# Patient Record
Sex: Female | Born: 1960 | Race: Black or African American | Hispanic: No | Smoking: Never smoker
Health system: Southern US, Community
[De-identification: ages and names within clinical notes are randomized; demographics above are authoritative.]

## PROBLEM LIST (undated history)

## (undated) DIAGNOSIS — E039 Hypothyroidism, unspecified: Secondary | ICD-10-CM

## (undated) DIAGNOSIS — J45909 Unspecified asthma, uncomplicated: Secondary | ICD-10-CM

## (undated) HISTORY — PX: OTHER SURGICAL HISTORY: SHX169

---

## 1998-01-24 ENCOUNTER — Encounter: Admission: RE | Admit: 1998-01-24 | Discharge: 1998-04-24 | Payer: Self-pay | Admitting: Radiation Oncology

## 2004-12-26 ENCOUNTER — Emergency Department (HOSPITAL_COMMUNITY): Admission: EM | Admit: 2004-12-26 | Discharge: 2004-12-26 | Payer: Self-pay | Admitting: Emergency Medicine

## 2005-03-18 ENCOUNTER — Ambulatory Visit: Payer: Self-pay | Admitting: Internal Medicine

## 2006-09-03 ENCOUNTER — Ambulatory Visit: Payer: Self-pay | Admitting: Internal Medicine

## 2007-10-05 ENCOUNTER — Ambulatory Visit: Payer: Self-pay | Admitting: Gastroenterology

## 2007-10-19 ENCOUNTER — Ambulatory Visit (HOSPITAL_COMMUNITY): Admission: RE | Admit: 2007-10-19 | Discharge: 2007-10-19 | Payer: Self-pay | Admitting: Gastroenterology

## 2008-03-22 ENCOUNTER — Ambulatory Visit: Payer: Self-pay | Admitting: Gastroenterology

## 2008-10-31 ENCOUNTER — Ambulatory Visit: Payer: Self-pay | Admitting: Gastroenterology

## 2008-11-07 ENCOUNTER — Ambulatory Visit (HOSPITAL_COMMUNITY): Admission: RE | Admit: 2008-11-07 | Discharge: 2008-11-07 | Payer: Self-pay | Admitting: Gastroenterology

## 2009-04-16 ENCOUNTER — Encounter (INDEPENDENT_AMBULATORY_CARE_PROVIDER_SITE_OTHER): Payer: Self-pay

## 2009-04-18 ENCOUNTER — Encounter: Payer: Self-pay | Admitting: Gastroenterology

## 2009-06-06 ENCOUNTER — Ambulatory Visit: Payer: Self-pay | Admitting: Gastroenterology

## 2009-06-07 DIAGNOSIS — K7460 Unspecified cirrhosis of liver: Secondary | ICD-10-CM | POA: Insufficient documentation

## 2009-10-18 ENCOUNTER — Encounter (INDEPENDENT_AMBULATORY_CARE_PROVIDER_SITE_OTHER): Payer: Self-pay | Admitting: *Deleted

## 2009-11-19 ENCOUNTER — Encounter (INDEPENDENT_AMBULATORY_CARE_PROVIDER_SITE_OTHER): Payer: Self-pay | Admitting: *Deleted

## 2009-12-11 ENCOUNTER — Ambulatory Visit: Payer: Self-pay | Admitting: Gastroenterology

## 2009-12-11 LAB — CONVERTED CEMR LAB
ALT: 16 units/L (ref 0–35)
AST: 22 units/L (ref 0–37)
Basophils Relative: 0 % (ref 0–1)
Chloride: 103 meq/L (ref 96–112)
Creatinine, Ser: 1.15 mg/dL (ref 0.40–1.20)
Eosinophils Absolute: 0.6 10*3/uL (ref 0.0–0.7)
Eosinophils Relative: 6 % — ABNORMAL HIGH (ref 0–5)
HCT: 36.3 % (ref 36.0–46.0)
Hemoglobin: 12.2 g/dL (ref 12.0–15.0)
Lymphs Abs: 3.5 10*3/uL (ref 0.7–4.0)
MCHC: 33.6 g/dL (ref 30.0–36.0)
MCV: 95 fL (ref 78.0–100.0)
Monocytes Absolute: 0.8 10*3/uL (ref 0.1–1.0)
Monocytes Relative: 8 % (ref 3–12)
Neutrophils Relative %: 54 % (ref 43–77)
RBC: 3.82 M/uL — ABNORMAL LOW (ref 3.87–5.11)
Sodium: 136 meq/L (ref 135–145)
Total Bilirubin: 0.7 mg/dL (ref 0.3–1.2)
WBC: 10.5 10*3/uL (ref 4.0–10.5)

## 2009-12-14 ENCOUNTER — Telehealth (INDEPENDENT_AMBULATORY_CARE_PROVIDER_SITE_OTHER): Payer: Self-pay

## 2009-12-20 ENCOUNTER — Ambulatory Visit (HOSPITAL_COMMUNITY): Admission: RE | Admit: 2009-12-20 | Discharge: 2009-12-20 | Payer: Self-pay | Admitting: Gastroenterology

## 2010-06-27 ENCOUNTER — Ambulatory Visit: Payer: Self-pay | Admitting: Gastroenterology

## 2010-06-27 DIAGNOSIS — K754 Autoimmune hepatitis: Secondary | ICD-10-CM

## 2010-09-24 ENCOUNTER — Encounter (INDEPENDENT_AMBULATORY_CARE_PROVIDER_SITE_OTHER): Payer: Self-pay | Admitting: *Deleted

## 2010-09-26 ENCOUNTER — Telehealth (INDEPENDENT_AMBULATORY_CARE_PROVIDER_SITE_OTHER): Payer: Self-pay

## 2010-11-19 NOTE — Letter (Signed)
Summary: ABD [place] ORDER  ABD [place] ORDER   Imported By: Ave Filter 12/11/2009 16:36:33  _____________________________________________________________________  External Attachment:    Type:   Image     Comment:   External Document

## 2010-11-19 NOTE — Assessment & Plan Note (Signed)
Summary: AIH, CIRRHOSIS   Visit Type:  Follow-up Visit Primary Care Provider:  Juanetta Gosling, M.D.  Chief Complaint:  follow up- doing ok.  History of Present Illness: Working out on the treadmill 1 hour a day. No fatigue, itching, or yellow eyes. No questions or concerns.  Current Medications (verified): 1)  Advair Inhalet 2)  Levothroid 100 Mcg Tabs (Levothyroxine Sodium) .... Once Daily 3)  Calcium 4)  Vitamin D 3 1000iy .... Once Daily 5)  Loestrin Fe 1/20 1-20 Mg-Mcg Tabs (Norethin Ace-Eth Estrad-Fe) .... Once Daily  Allergies (verified): 1)  ! Advil 2)  ! Ibuprofen  Past History:  Past Medical History: Last updated: 06/05/2009 Asthma Hypothyroidism Autoimmune hepatitis with evidence of cirrhosis  Review of Systems       FEB 2011: NL CMP, CBC, PT/INR, AFP, ABD U/S-CIRRHOSIS  Vital Signs:  Patient profile:   50 year old female Height:      63 inches Weight:      190 pounds BMI:     33.78 Temp:     98.0 degrees F oral Pulse rate:   76 / minute BP sitting:   124 / 80  (left arm) Cuff size:   regular  Vitals Entered By: Hendricks Limes LPN (June 27, 2010 3:44 PM)  Physical Exam  General:  Well developed, well nourished, no acute distress. Head:  Normocephalic and atraumatic. Eyes:  PERRL, no icterus. Lungs:  Clear throughout to auscultation. Heart:  Regular rate and rhythm; no murmurs. Abdomen:  Soft, nontender and nondistended. Normal bowel sounds.  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 OPV APR 2012 per pt request. Repeat labs and U/S APR 2012.  CC: PCP  Appended Document: AIH, CIRRHOSIS F/U OPV IN APRIL 2012 IS IN THE COMPUTER  Appended Document: Orders Update    Clinical Lists Changes  Problems: Added new problem of AUTOIMMUNE HEPATITIS (ICD-571.42) Orders: Added new Service order of Est. Patient Level II ([address]109) - Signed

## 2010-11-19 NOTE — Progress Notes (Signed)
Summary: Notify pt of normal labs  Phone Note Outgoing Call   Call placed by: Koray Soter Call placed to: Patient Summary of Call: Called pt to inform of normal Hemoglobin, plts, lft's and afp tumor marker per Dr. Darrick Penna note in dictation from OV. LMOM for a return call. Initial call taken by: Cloria Spring LPN,  December 14, 2009 11:29 AM     Appended Document: Notify pt of normal labs Pt informed.

## 2010-11-19 NOTE — Medication Information (Signed)
Summary: LEVOTHYROXIN  LEVOTHYROXIN   Imported By: Rexene Alberts 09/24/2010 16:16:10  _____________________________________________________________________  External Attachment:    Type:   Image     Comment:   External Document  Appended Document: LEVOTHYROXIN Pt needs to call PCP for refills.  Appended Document: LEVOTHYROXIN pharmacy aware

## 2010-11-19 NOTE — Assessment & Plan Note (Signed)
Summary: CIRROHSIS, AIH   Visit Type:  Follow-up Visit Primary Care Provider:  Juanetta Gosling, M.D.  Chief Complaint:  follow up.  History of Present Illness: No itching, weight loss, nausea, vomiting, yellow eyes. Appetite is good. Working causes fatigue.  Current Medications (verified): 1)  Advair Inhalet 2)  Levothyroxine 3)  Calcium 4)  Vitamins  Allergies (verified): 1)  ! Advil 2)  ! Ibuprofen  Past History:  Past Medical History: Last updated: 06/05/2009 Asthma Hypothyroidism Autoimmune hepatitis with evidence of cirrhosis  Vital Signs:  Patient profile:   50 year old female Height:      63 inches Weight:      188 pounds BMI:     33.42 Temp:     97.7 degrees F oral Pulse rate:   68 / minute BP sitting:   118 / 72  (left arm) Cuff size:   regular  Vitals Entered By: Hendricks Limes LPN (December 11, 2009 4:12 PM)  Physical Exam  General:  Well developed, well nourished, no acute distress. Head:  Normocephalic and atraumatic. Lungs:  Clear throughout to auscultation. Heart:  Regular rate and rhythm; no murmurs. Abdomen:  Soft, nontender and nondistended. Normal bowel sounds.  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 Currently in remission. Will need AFP, PT/INR, CBC, & CMP and ABD U/S. OPV in 6 mos.  CC: PCP  Orders: T-CBC w/Diff ([id number]) T-Comprehensive Metabolic Panel [phone]) T-AFP Tumor Markers ([id number]) T-PT (Prothrombin Time) (57846) Est. Patient Level II (96295)  ADDENDUM: 22311-Please call pt her Hb, plts, and liver enzymes are normal. Her tumor marker is normal.

## 2010-11-19 NOTE — Letter (Signed)
Summary: Appointment Reminder  [facility]  [address]   [place], [place] [address]   Phone: [phone]  Fax: [phone]       November 19, 2009   PAIGE MONARREZ [address] [place], [place]  [address] 1961-05-04    Dear Ms. Maisie Fus,  We have been unable to reach you by phone to schedule a follow up   appointment that was recommended for you by Dr. Jena Gauss. It is very   important that we reach you to schedule an appointment. We hope that you  allow [place] to participate in your health care needs. Please contact us at  [phone] at your earliest convenience to schedule your appointment.  Sincerely,    [facility] R. Roetta Sessions, M.D.    Kassie Mends, M.D. Lorenza Burton, FNP-BC    Tana Coast, PA-C Phone: [phone]    Fax: [phone]

## 2010-11-21 NOTE — Progress Notes (Signed)
Summary: pt upset about refills  Phone Note Call from Patient Call back at Home Phone [phone]   Caller: Patient Summary of Call: pt called wanting to know why we were not refilling her tyroid medication. she stated she has never had any problems before getting it refilled and was very upset that we werent refilling it. she stated we were "like the grinch that stole Christmas". Pt also said NUR put her on thyroid medication and told her to never be with out it. pt was talking so fast and was not allowing me to answer her questions. When she finally stopped I informed her that [facility] wanted her to get her refill from her PCP and pt stated "that was just fine and I will never come to your office again" and hung up the phone. Initial call taken by: Hendricks Limes LPN,  September 26, 2010 10:59 AM     Appended Document: pt upset about refills FAX PHONE NOTE TO DR. Patty Sermons OFFICE.  Appended Document: pt upset about refills PHONE NOTE FAXED AND FUTURE APPTS CANCELED

## 2011-03-04 NOTE — Assessment & Plan Note (Signed)
NAME:  Joyce Mcdonald, Joyce Mcdonald                 CHART#:  [id number]   DATE:  03/22/2008                       DOB:  [birth year]   REFERRING PHYSICIAN:  Oneal Deputy. Juanetta Gosling, MD   PROBLEM LIST:  1. Autoimmune hepatitis with cirrhosis, on no steroid treatment since      1998.  2. Hypothyroidism.  3. Asthma.   SUBJECTIVE:  Joyce Mcdonald is a 50 year old female who presents as a return-  patient visit.  She was last seen in December 2007.  She denies any  itching.  Her energy level is good.  She has not had any rectal bleeding  or change in her bowel habits.  She is having 2 bowel movements a day.  Her appetite is good.   MEDICATIONS:  1. Advair inhaler.  2. Levothyroxine.  3. Calcium.   OBJECTIVE:  Physical Exam: Weight 187 pounds, height 5 feet 4 inches,  BMI 32.1 (obese), temperature 98.1, blood pressure 132/82, and pulse  76.GENERAL:  She is in no apparent distress, alert and oriented  x4.LUNGS:  Clear to auscultation bilaterally.  CARDIOVASCULAR:  Regular rhythm.ABDOMEN:  Bowel sounds are present.  Soft, nontender, nondistended, and obese.   ASSESSMENT:  Joyce Mcdonald is a 50 year old female who was diagnosed with  autoimmune hepatitis in the 90s.  She had an ultrasound in December  2008, which showed diffuse hepatocellular disease consistent with  cirrhosis.  Her AFP in December was normal.  She is obese, which can  adversely affect people who have cirrhosis of the liver.   Thank you for allowing me to see Joyce Mcdonald in consultation.  My  recommendations as follow.   RECOMMENDATIONS:  1. I recommended to Joyce Mcdonald that she lose 10 pounds within the next      6 months.  More would be better, but 10 pounds would be      satisfactory.  I did explain to her the risk of fat deposition into      a cirrhotic liver may cause worsening of cirrhosis and elevated      liver enzymes.  2. We will check alpha fetoprotein and hepatic panel today.  3. She declined a handout on low-fat diet, and  she  knows what she      needs to do.  As well, I discussed the need for age-appropriate      colorectal cancer screening.  The [employer] [facility]      Gastroenterology suggests that African Americans should be screened      at age 60.  I told her I will be more than happy to ask her      insurance company if they would cover the cost of the procedure,      and currently she has declined.  4. Follow up appointment in 6 months.       Kassie Mcdonald, M.D.  Electronically Signed     SM/MEDQ  D:  03/22/2008  T:  03/23/2008  Job:  [id number]   cc:   Joyce Mcdonald, M.D.

## 2011-03-04 NOTE — Assessment & Plan Note (Signed)
NAME:  Joyce Mcdonald, Joyce Mcdonald                 CHART#:  [id number]   DATE:  10/05/2007                       DOB:  1960-10-22   REFERRING PHYSICIAN:  Kari Baars.   PROBLEM LIST:  1. Autoimmune hepatitis with cirrhosis, no steroid treatments since      1998.  2. Hypothyroidism.  3. Asthma.   SUBJECTIVE:  Joyce Mcdonald is a 50 year old female who was last seen in  November 2007.  She denies any itching, abdominal pain, or jaundice.  Her energy level is normal.  Her appetite is good.  She has 1 to 2  normal bowel movements a day.  She denies any bright red blood per  rectum, black, tarry stools, or hematemesis.   PAST SURGICAL HISTORY:  Toe surgery.   FAMILY HISTORY:  She has no family history of liver cancer, colon  polyps, or colon cancer.   ALLERGIES:  IBUPROFEN AND ADVIL CAUSE HER TO FEEL SHORT OF BREATH.   MEDICATIONS:  1. Advair inhaler.  2. Levothyroxine.  3. Calcium.   SOCIAL HISTORY:  She works at [employer] in [place] for the last 5  years.  She denies any tobacco or alcohol use.   PHYSICAL EXAMINATION:  Weight 185 pounds (up 9 pounds since November of  2007).  Height 5 feet 3 inches.  BMI 30.8 (obese).  Temperature 97.9.  Blood pressure 128/80.  Pulse 64.  GENERAL:  She is in no apparent distress, alert and oriented x4.  HEENT:  Atraumatic, normocephalic.  Pupils equal and reactive to light.  Mouth, no oral lesions.  Posterior pharynx without erythema or exudate,  sclerae anicteric.  NECK:  Full range of motion, no lymphadenopathy.  LUNGS:  Clear to auscultation bilaterally.  CARDIOVASCULAR:  Exam is a regular rhythm.  No murmur.  Normal S1 and  S2.  ABDOMEN:  Bowel sounds are present.  Soft, nontender, and nondistended.  No rebound or guarding, obese.  NEURO:  She has no focal neurologic deficits.   ASSESSMENT:  Joyce Mcdonald is a 50 year old female with autoimmune  hepatitis which is believed to be in remission.  She did have a biopsy  during her initial workup that  showed fibrosis and cirrhosis.  She  reports being vaccinated for hepatitis A and possibly hepatitis B.   Thank you for allowing me to see Joyce Mcdonald in consultation.  My  recommendations follow.   RECOMMENDATIONS:  1. She should have an alpha-fetoprotein every 6 months.  It will be      drawn today.  She will also have a CBC, complete metabolic panel,      and PT/INR today.  2. She will have an abdominal ultrasound yearly if she has evidence      for cirrhosis on the exam.  3. Follow up in 6 months.  4. We will check a hepatitis B surface antibody and hepatitis A IgG to      make sure she has been appropriately vaccinated.  5. I did caution her that she needs to lose about 10 to 15 pounds      because being obese increases her risk for cirrhosis because she      can have fatty infiltration of the liver.  6. We will call Joyce Mcdonald with the results of her labs at [phone] or      [phone] (  cell).       Kassie Mends, M.D.  Electronically Signed     SM/MEDQ  D:  10/05/2007  T:  10/06/2007  Job:  [id number]   cc:   Ramon Dredge L. Juanetta Gosling, M.D.

## 2011-03-04 NOTE — Assessment & Plan Note (Signed)
NAME:  Joyce Mcdonald, Joyce Mcdonald                 CHART#:  [id number]   DATE:  10/31/2008                       DOB:  Feb 27, 1961   REFERRING PHYSICIAN:  Oneal Deputy. Juanetta Gosling, MD   PROBLEM LIST:  1. Autoimmune hepatitis with evidence of cirrhosis and no steroid      treatment since 1998.  2. Hypothyroidism.  3. Asthma.   SUBJECTIVE:  The patient is a 50 year old female who presents as a  return patient visit.  She denies any itching yellow eyes, nausea,  vomiting, or rectal bleeding.  She does not use tobacco or alcohol  products.  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  For her enjoyment, she  reads the Bible and inspirational book such as Delrae Rend.  She has 2  bowel movements a day.  She is beginning to exercise once a week on the  treadmill for an hour.   MEDICATIONS:  1. Advair inhaler.  2. Levothyroxine.  3. Calcium daily.   OBJECTIVE:  VITAL SIGNS:  Weight 183 pounds (down 4 pounds since June  2009), height 5 feet 4 inches, temperature 98, blood pressure 110/78,  and pulse 64.GENERAL:  She is in no apparent distress.  Alert and  oriented x4.HEENT:  Atraumatic, normocephalic.  Pupils equal and  reactive to the light.  Mouth, no oral lesions.  Posterior pharynx  without erythema or exudate.  NECK:  Full range of motion.  No lymphadenopathy.LUNGS:  Clear to  auscultation bilaterally.CARDIOVASCULAR:  Regular rhythm.  No murmur.  Normal S1 and S2.ABDOMEN:  Bowel sounds are present, soft, nontender,  nondistended, and obese.   ASSESSMENT:  The patient is a 50 year old female with autoimmune  hepatitis diagnosed in the 90s and is in remission.  Her last ultrasound  was in December 2008.  She also had an alpha-fetoprotein, which was  within normal limits in June 2009.  Thank you for allowing me to see the  patient in consultation.  My recommendations follow.   RECOMMENDATIONS:  1. I encouraged the patient to proceed with weight loss.  Encouraged      her to increase her exercise 3 times a day  to assist with that.  2. She needs an AFP twice a year and ultrasound yearly.  Drew AFP on      today and schedule for ultrasound.  3. She has a follow up appointment to see me in 6 months.   ADDENDUM 14782:  AFP nl, U/S: cirrhoisis.       Kassie Mends, M.D.  Electronically Signed     SM/MEDQ  D:  10/31/2008  T:  11/01/2008  Job:  [id number]   cc:   Ramon Dredge L. Juanetta Gosling, M.D.

## 2012-04-15 ENCOUNTER — Other Ambulatory Visit (HOSPITAL_COMMUNITY): Payer: Self-pay | Admitting: Pulmonary Disease

## 2012-04-15 DIAGNOSIS — Z139 Encounter for screening, unspecified: Secondary | ICD-10-CM

## 2012-04-23 ENCOUNTER — Ambulatory Visit (HOSPITAL_COMMUNITY)
Admission: RE | Admit: 2012-04-23 | Discharge: 2012-04-23 | Disposition: A | Discharge status: Home / Self Care | Payer: BC Managed Care – PPO | Source: Ambulatory Visit | Attending: Pulmonary Disease | Admitting: Pulmonary Disease

## 2012-04-23 DIAGNOSIS — Z139 Encounter for screening, unspecified: Secondary | ICD-10-CM

## 2012-04-23 DIAGNOSIS — Z1231 Encounter for screening mammogram for malignant neoplasm of breast: Secondary | ICD-10-CM | POA: Insufficient documentation

## 2012-04-26 ENCOUNTER — Telehealth: Payer: Self-pay | Admitting: Urgent Care

## 2012-04-26 NOTE — Telephone Encounter (Signed)
Pt has called back twice since this morning. Her Insurance is BCBS, group # [id number], subscriber # [id number]. She said you could [facility] with her procedure date and time.

## 2012-04-26 NOTE — Telephone Encounter (Signed)
LMOM for pt to call to be triaged.

## 2012-04-26 NOTE — Telephone Encounter (Signed)
Pt called while on her lunch break and said you can reach her at home or she will call us around 4pm. She had received a letter recently about setting her up for tcs

## 2012-04-27 ENCOUNTER — Telehealth: Payer: Self-pay

## 2012-04-27 ENCOUNTER — Other Ambulatory Visit: Payer: Self-pay

## 2012-04-27 DIAGNOSIS — Z139 Encounter for screening, unspecified: Secondary | ICD-10-CM

## 2012-04-27 NOTE — Telephone Encounter (Signed)
Gastroenterology Pre-Procedure Form    Request Date: 04/27/2012       Requesting Physician: Dr. Juanetta Gosling     PATIENT INFORMATION:  Joyce Mcdonald is a 51 y.o., female (DOB=1961-08-18).  PROCEDURE: Procedure(s) requested: colonoscopy Procedure Reason: screening for colon cancer  PATIENT REVIEW QUESTIONS: The patient reports the following:   1. Diabetes Melitis: no 2. Joint replacements in the past 12 months: no 3. Major health problems in the past 3 months: no 4. Has an artificial valve or MVP:no 5. Has been advised in past to take antibiotics in advance of a procedure like teeth cleaning: no}    MEDICATIONS & ALLERGIES:    Patient reports the following regarding taking any blood thinners:   Plavix? no Aspirin?no Coumadin?  no  Patient confirms/reports the following medications:  Current Outpatient Prescriptions  Medication Sig Dispense Refill  . Fluticasone-Salmeterol (ADVAIR) 100-50 MCG/DOSE AEPB Inhale 1 puff into the lungs every 12 (twelve) hours.      Marland Kitchen levothyroxine (SYNTHROID, LEVOTHROID) 112 MCG tablet Take 112 mcg by mouth daily.        Patient confirms/reports the following allergies:  Allergies  Allergen Reactions  . Ibuprofen     Patient is appropriate to schedule for requested procedure(s): yes  AUTHORIZATION INFORMATION Primary Insurance:   ID #:   Group #:  Pre-Cert / Auth required: Pre-Cert / Auth #:   Secondary Insurance: ID #:   Group #:  Pre-Cert / Auth required:  Pre-Cert / Auth #:   No orders of the defined types were placed in this encounter.    SCHEDULE INFORMATION: Procedure has been scheduled as follows:  Date: 05/10/2012      Time: 12:30 PM  Location: [facility] Short Stay  This Gastroenterology Pre-Precedure Form is being routed to the following provider(s) for review: Jonette Eva, MD

## 2012-04-28 ENCOUNTER — Encounter (HOSPITAL_COMMUNITY): Payer: Self-pay | Admitting: Pharmacy Technician

## 2012-04-28 MED ORDER — SOD PICOSULFATE-MAG OX-CIT ACD 10-3.5-12 MG-GM-GM PO PACK: 1.0000 | PACK | Freq: Once | ORAL | Status: DC

## 2012-04-28 NOTE — Telephone Encounter (Signed)
Pt scheduled for 05/10/2012 @ 12:30 PM for colonoscopy.

## 2012-04-28 NOTE — Telephone Encounter (Signed)
PREPOPIK-DRINK WATER TO KEEP URINE LIGHT YELLOW.  PT SHOULD DROP OFF RX 3 DAYS PRIOR TO PROCEDURE.  

## 2012-04-28 NOTE — Telephone Encounter (Signed)
Pt's prescription and instructions faxed to [facility] in [place].

## 2012-05-10 ENCOUNTER — Encounter (HOSPITAL_COMMUNITY): Payer: Self-pay | Admitting: *Deleted

## 2012-05-10 ENCOUNTER — Ambulatory Visit (HOSPITAL_COMMUNITY)
Admission: RE | Admit: 2012-05-10 | Discharge: 2012-05-10 | Disposition: A | Discharge status: Home / Self Care | Payer: BC Managed Care – PPO | Source: Ambulatory Visit | Attending: Gastroenterology | Admitting: Gastroenterology

## 2012-05-10 ENCOUNTER — Encounter (HOSPITAL_COMMUNITY)
Admission: RE | Disposition: A | Discharge status: Home / Self Care | Payer: Self-pay | Source: Ambulatory Visit | Attending: Gastroenterology

## 2012-05-10 DIAGNOSIS — Z139 Encounter for screening, unspecified: Secondary | ICD-10-CM

## 2012-05-10 DIAGNOSIS — Z1211 Encounter for screening for malignant neoplasm of colon: Secondary | ICD-10-CM | POA: Insufficient documentation

## 2012-05-10 DIAGNOSIS — D126 Benign neoplasm of colon, unspecified: Secondary | ICD-10-CM | POA: Insufficient documentation

## 2012-05-10 DIAGNOSIS — K648 Other hemorrhoids: Secondary | ICD-10-CM | POA: Insufficient documentation

## 2012-05-10 HISTORY — PX: COLONOSCOPY: SHX5424

## 2012-05-10 HISTORY — DX: Unspecified asthma, uncomplicated: J45.909

## 2012-05-10 HISTORY — DX: Hypothyroidism, unspecified: E03.9

## 2012-05-10 SURGERY — COLONOSCOPY
Anesthesia: Moderate Sedation

## 2012-05-10 MED ORDER — MIDAZOLAM HCL 5 MG/5ML IJ SOLN
INTRAMUSCULAR | Status: AC
  Filled 2012-05-10: qty 10

## 2012-05-10 MED ORDER — MEPERIDINE HCL 100 MG/ML IJ SOLN
INTRAMUSCULAR | Status: AC
  Filled 2012-05-10: qty 2

## 2012-05-10 MED ORDER — MEPERIDINE HCL 100 MG/ML IJ SOLN
INTRAMUSCULAR | Status: DC | PRN
  Administered 2012-05-10: 50 mg via INTRAVENOUS
  Administered 2012-05-10: 25 mg via INTRAVENOUS

## 2012-05-10 MED ORDER — SODIUM CHLORIDE 0.45 % IV SOLN
Freq: Once | INTRAVENOUS | Status: AC
  Administered 2012-05-10: 12:00:00 via INTRAVENOUS

## 2012-05-10 MED ORDER — MIDAZOLAM HCL 5 MG/5ML IJ SOLN
INTRAMUSCULAR | Status: DC | PRN
  Administered 2012-05-10 (×2): 2 mg via INTRAVENOUS

## 2012-05-10 MED ORDER — STERILE WATER FOR IRRIGATION IR SOLN
Status: DC | PRN
  Administered 2012-05-10: 13:00:00

## 2012-05-10 NOTE — Op Note (Signed)
[facility] [address] [place], [place]  [address]  COLONOSCOPY PROCEDURE REPORT  PATIENT:  Joyce Mcdonald, Joyce Mcdonald  MR#:  [id number] BIRTHDATE:  03-29-1961, 51 yrs. old  GENDER:  female  ENDOSCOPIST:  Jonette Eva, MD REF. BY:  Kari Baars, M.D. ASSISTANT:  PROCEDURE DATE:  05/10/2012 PROCEDURE:  Colonoscopy with biopsy  INDICATIONS:  Screening PT HAS NOT FOLLOWED UP FOR CIRRHOSIS IN > 2 YEARS. SHE WAS DISSATISFIED WITH A STAFF MEMBER. SHE IS OVERDUE FOR IMAGING, EGD, AND AFP.  MEDICATIONS:   Demerol 75 mg IV, Versed 4 mg IV  DESCRIPTION OF PROCEDURE:    Physical exam was performed. Informed consent was obtained from the patient after explaining the benefits, risks, and alternatives to procedure.  The patient was connected to monitor and placed in left lateral position. Continuous oxygen was provided by nasal cannula and IV medicine administered through an indwelling cannula.  After administration of sedation and rectal exam, the patient's rectum was intubated and the EC-[id number] ([id number]) colonoscope was advanced under direct visualization to the cecum.  The scope was removed slowly by carefully examining the color, texture, anatomy, and integrity mucosa on the way out.  The patient was recovered in endoscopy and discharged home in satisfactory condition. <<PROCEDUREIMAGES>>  FINDINGS:  There was ONE 3 mm sessile polyps identified and removed. in the ascending colon VIA COLD FORCEPS.  SMALL Internal Hemorrhoids were found. NO DIVERTICULA.  PREP QUALITY: GOOD CECAL W/D TIME:    9.5 minutes  COMPLICATIONS:    None  ENDOSCOPIC IMPRESSION: 1) Polyp in the ascending colon 2) Internal hemorrhoids  RECOMMENDATIONS: 1) Await biopsy results 2) High fiber diet. 3) Repeat colonoscopy in 10 years 4) OPV W/ SLF IN WITHIN 3 MOS  REPEAT EXAM:  No  ______________________________ Jonette Eva, MD  CC:  Kari Baars, M.D.  n. eSIGNED:   Marquasia Schmieder at 05/10/2012  01:30 PM  Joyce Mcdonald, [id number]

## 2012-05-10 NOTE — H&P (Signed)
  Primary Care Physician:  Fredirick Maudlin, MD Primary Gastroenterologist:  Dr. Darrick Penna  Pre-Procedure History & Physical: HPI:  Joyce Mcdonald is a 51 y.o. female here for COLON CANCER SCREENING.   Past Medical History  Diagnosis Date  . Asthma   . Hypothyroidism     Past Surgical History  Procedure Date  . Right great toe     Prior to Admission medications   Medication Sig Start Date End Date Taking? Authorizing Provider  acetaminophen (TYLENOL) 500 MG tablet Take 1,000 mg by mouth every 6 (six) hours as needed. For pain   Yes Historical Provider, MD  Calcium Carbonate-Vitamin D (CALCIUM + D PO) Take 1 tablet by mouth daily.   Yes Historical Provider, MD  Fluticasone-Salmeterol (ADVAIR) 100-50 MCG/DOSE AEPB Inhale 1 puff into the lungs every 12 (twelve) hours.   Yes Historical Provider, MD  Naphazoline-Pheniramine (NAPHCON-A OP) Apply 1 drop to eye as needed. For dry eyes   Yes Historical Provider, MD  levothyroxine (SYNTHROID, LEVOTHROID) 112 MCG tablet Take 112 mcg by mouth daily.    Historical Provider, MD    Allergies as of 04/27/2012 - Review Complete 04/27/2012  Allergen Reaction Noted  . Ibuprofen      Family History  Problem Relation Age of Onset  . Colon cancer Neg Hx     History   Social History  . Marital Status: Widowed    Spouse Name: N/A    Number of Children: N/A  . Years of Education: N/A   Occupational History  . Not on file.   Social History Main Topics  . Smoking status: Never Smoker   . Smokeless tobacco: Not on file  . Alcohol Use: No  . Drug Use: No  . Sexually Active:    Other Topics Concern  . Not on file   Social History Narrative  . No narrative on file    Review of Systems: See HPI, otherwise negative ROS   Physical Exam: BP 137/76  Pulse 60  Temp 97.7 F (36.5 C) (Oral)  Resp 18  Ht 5\' 3"  (1.6 m)  Wt 185 lb (83.915 kg)  BMI 32.77 kg/m2  SpO2 96% General:   Alert,  pleasant and cooperative in NAD Head:   Normocephalic and atraumatic. Neck:  Supple;  Lungs:  Clear throughout to auscultation.    Heart:  Regular rate and rhythm. Abdomen:  Soft, nontender and nondistended. Normal bowel sounds, without guarding, and without rebound.   Neurologic:  Alert and  oriented x4;  grossly normal neurologically.  Impression/Plan:     SCREENING  Plan:  1. TCS TODAY

## 2012-05-13 ENCOUNTER — Encounter (HOSPITAL_COMMUNITY): Payer: Self-pay | Admitting: Gastroenterology

## 2012-05-14 ENCOUNTER — Telehealth: Payer: Self-pay | Admitting: Gastroenterology

## 2012-05-14 NOTE — Telephone Encounter (Signed)
Please call pt. She had a polypoid lesion, removed and it was benign. TCS in 10 years. High fiber diet.  

## 2012-05-17 NOTE — Telephone Encounter (Signed)
LMOM to call.

## 2012-05-17 NOTE — Telephone Encounter (Signed)
Recall made 

## 2012-05-18 NOTE — Telephone Encounter (Signed)
Pt called and was informed.  

## 2012-07-06 ENCOUNTER — Encounter: Payer: Self-pay | Admitting: Internal Medicine

## 2012-07-07 ENCOUNTER — Encounter: Payer: Self-pay | Admitting: Gastroenterology

## 2012-07-07 ENCOUNTER — Ambulatory Visit (INDEPENDENT_AMBULATORY_CARE_PROVIDER_SITE_OTHER): Payer: BC Managed Care – PPO | Admitting: Gastroenterology

## 2012-07-07 VITALS — BP 111/72 | HR 67 | Temp 97.4°F | Ht 64.0 in | Wt 191.6 lb

## 2012-07-07 DIAGNOSIS — Z1211 Encounter for screening for malignant neoplasm of colon: Secondary | ICD-10-CM

## 2012-07-07 DIAGNOSIS — K7460 Unspecified cirrhosis of liver: Secondary | ICD-10-CM

## 2012-07-07 DIAGNOSIS — K754 Autoimmune hepatitis: Secondary | ICD-10-CM

## 2012-07-07 LAB — COMPREHENSIVE METABOLIC PANEL
CO2: 29 mEq/L (ref 19–32)
Calcium: 9.3 mg/dL (ref 8.4–10.5)
Chloride: 105 mEq/L (ref 96–112)
Glucose, Bld: 91 mg/dL (ref 70–99)
Sodium: 137 mEq/L (ref 135–145)
Total Bilirubin: 1 mg/dL (ref 0.3–1.2)
Total Protein: 7 g/dL (ref 6.0–8.3)

## 2012-07-07 LAB — CBC WITH DIFFERENTIAL/PLATELET
Basophils Absolute: 0 10*3/uL (ref 0.0–0.1)
Eosinophils Absolute: 0.5 10*3/uL (ref 0.0–0.7)
Eosinophils Relative: 5 % (ref 0–5)
Lymphs Abs: 2.6 10*3/uL (ref 0.7–4.0)
MCH: 31.8 pg (ref 26.0–34.0)
MCV: 95.5 fL (ref 78.0–100.0)
Monocytes Absolute: 1 10*3/uL (ref 0.1–1.0)
Platelets: 316 10*3/uL (ref 150–400)
RDW: 15.1 % (ref 11.5–15.5)

## 2012-07-07 NOTE — Assessment & Plan Note (Signed)
NO SX OF ACTIVE DISEASE   NEEDS LABS  Q6 MOS AND ABDOMINAL ULTRASOUND QYR.  FOLLOW A LOW FAT DIET. HO GIVEN.  LOSE 10 TO 20 LBS OVER THE NEXT 6 MOS TO 1 YEAR. EXPLAINED RISK OF WORSENING CIRRHOSIS IF SHE CARRIES TOO MUCH WEIGHT.  FOLLOW UP IN 6 MOS.

## 2012-07-07 NOTE — Progress Notes (Signed)
Faxed to PCP

## 2012-07-07 NOTE — Progress Notes (Signed)
  Subjective:    Patient ID: Joyce Mcdonald, female    DOB: July 15, 1961, 51 y.o.   MRN: [id number]  PCP: Juanetta Gosling  HPI PT NOT SEEN SINCE 2011. PT HAD A NEGATIVE ENCOUNTER ON THE TELEPHONE WITH A NURSE.  PT HAS NO QUESTION OR CONCERNS.  Past Medical History  Diagnosis Date  . Asthma   . Hypothyroidism     Past Surgical History  Procedure Date  . Right great toe   . Colonoscopy 05/10/2012    Polyp in the ascending colon/Internal hemorrhoids    Allergies  Allergen Reactions  . Ibuprofen Other (See Comments)    Flares up asthma    Current Outpatient Prescriptions  Medication Sig Dispense Refill  . acetaminophen (TYLENOL) 500 MG tablet Take 1,000 mg by mouth every 6 (six) hours as needed. For pain      . Calcium Carbonate-Vitamin D (CALCIUM + D PO) Take 1 tablet by mouth daily.      . Fluticasone-Salmeterol (ADVAIR) 100-50 MCG/DOSE AEPB Inhale 1 puff into the lungs every 12 (twelve) hours.      Marland Kitchen levothyroxine (SYNTHROID, LEVOTHROID) 112 MCG tablet Take 112 mcg by mouth daily.      March Rummage (NAPHCON-A OP) Apply 1 drop to eye as needed. For dry eyes          Review of Systems     Objective:   Physical Exam  Vitals reviewed. Constitutional: She is oriented to person, place, and time. She appears well-nourished. No distress.  HENT:  Head: Normocephalic and atraumatic.  Mouth/Throat: Oropharynx is clear and moist. No oropharyngeal exudate.  Eyes: Pupils are equal, round, and reactive to light. No scleral icterus.  Neck: Normal range of motion. Neck supple.  Cardiovascular: Normal rate, regular rhythm and normal heart sounds.   Pulmonary/Chest: Effort normal and breath sounds normal. No respiratory distress.  Abdominal: Soft. Bowel sounds are normal. She exhibits no distension.  Musculoskeletal: Normal range of motion. She exhibits no edema.  Lymphadenopathy:    She has no cervical adenopathy.  Neurological: She is alert and oriented to person, place, and  time.       NO FOCAL DEFICITS   Psychiatric: She has a normal mood and affect.          Assessment & Plan:

## 2012-07-07 NOTE — Assessment & Plan Note (Signed)
REVIEWED TCS AND PICTURES.  HIGH IFBER DIET TCS IN 10 YEARS

## 2012-07-07 NOTE — Assessment & Plan Note (Signed)
NEEDS LABS  Q6 MOS AND ABDOMINAL ULTRASOUND QYR.  FOLLOW A LOW FAT DIET. HO GIVEN.  LOSE 10 TO 20 LBS OVER THE NEXT 6 MOS TO 1 YEAR. EXPLAINED RISK OF WORSENING CIRRHOSIS IF SHE CARRIES TOO MUCH WEIGHT.  FOLLOW UP IN 6 MOS.

## 2012-07-07 NOTE — Patient Instructions (Addendum)
PLEASE SEE ME EVERY 6 MOS.  YOU NEEDS LABS AND A ABDOMINAL ULTRASOUND.  FOLLOW A LOW FAT DIET. SEE INFO BELOW.  LOSE 10 TO 20 LBS OVER THE NEXT 6 MOS TO 1 YEAR.  FOLLOW UP IN 6 MOS.   Low-Fat Diet BREADS, CEREALS, PASTA, RICE, DRIED PEAS, AND BEANS These products are high in carbohydrates and most are low in fat. Therefore, they can be increased in the diet as substitutes for fatty foods. They too, however, contain calories and should not be eaten in excess. Cereals can be eaten for snacks as well as for breakfast.   FRUITS AND VEGETABLES It is good to eat fruits and vegetables. Besides being sources of fiber, both are rich in vitamins and some minerals. They help you get the daily allowances of these nutrients. Fruits and vegetables can be used for snacks and desserts.  MEATS Limit lean meat, chicken, [place], and fish to no more than 6 ounces per day. Beef, Pork, and Lamb Use lean cuts of beef, pork, and lamb. Lean cuts include:  Extra-lean ground beef.  Arm roast.  Sirloin tip.  Center-cut ham.  Round steak.  Loin chops.  Rump roast.  Tenderloin.  Trim all fat off the outside of meats before cooking. It is not necessary to severely decrease the intake of red meat, but lean choices should be made. Lean meat is rich in protein and contains a highly absorbable form of iron. Premenopausal women, in particular, should avoid reducing lean red meat because this could increase the risk for low red blood cells (iron-deficiency anemia).  Chicken and [place] These are good sources of protein. The fat of poultry can be reduced by removing the skin and underlying fat layers before cooking. Chicken and [place] can be substituted for lean red meat in the diet. Poultry should not be fried or covered with high-fat sauces. Fish and Shellfish Fish is a good source of protein. Shellfish contain cholesterol, but they usually are low in saturated fatty acids. The preparation of fish is important. Like  chicken and [place], they should not be fried or covered with high-fat sauces. EGGS Egg whites contain no fat or cholesterol. They can be eaten often. Try 1 to 2 egg whites instead of whole eggs in recipes or use egg substitutes that do not contain yolk. MILK AND DAIRY PRODUCTS Use skim or 1% milk instead of 2% or whole milk. Decrease whole milk, natural, and processed cheeses. Use nonfat or low-fat (2%) cottage cheese or low-fat cheeses made from vegetable oils. Choose nonfat or low-fat (1 to 2%) yogurt. Experiment with evaporated skim milk in recipes that call for heavy cream. Substitute low-fat yogurt or low-fat cottage cheese for sour cream in dips and salad dressings. Have at least 2 servings of low-fat dairy products, such as 2 glasses of skim (or 1%) milk each day to help get your daily calcium intake. FATS AND OILS Reduce the total intake of fats, especially saturated fat. Butterfat, lard, and beef fats are high in saturated fat and cholesterol. These should be avoided as much as possible. Vegetable fats do not contain cholesterol, but certain vegetable fats, such as coconut oil, palm oil, and palm kernel oil are very high in saturated fats. These should be limited. These fats are often used in bakery goods, processed foods, popcorn, oils, and nondairy creamers. Vegetable shortenings and some peanut butters contain hydrogenated oils, which are also saturated fats. Read the labels on these foods and check for saturated vegetable oils. Unsaturated  vegetable oils and fats do not raise blood cholesterol. However, they should be limited because they are fats and are high in calories. Total fat should still be limited to 30% of your daily caloric intake. Desirable liquid vegetable oils are corn oil, cottonseed oil, olive oil, canola oil, safflower oil, soybean oil, and sunflower oil. Peanut oil is not as good, but small amounts are acceptable. Buy a heart-healthy tub margarine that has no partially  hydrogenated oils in the ingredients. Mayonnaise and salad dressings often are made from unsaturated fats, but they should also be limited because of their high calorie and fat content. Seeds, nuts, peanut butter, olives, and avocados are high in fat, but the fat is mainly the unsaturated type. These foods should be limited mainly to avoid excess calories and fat. OTHER EATING TIPS Snacks  Most sweets should be limited as snacks. They tend to be rich in calories and fats, and their caloric content outweighs their nutritional value. Some good choices in snacks are graham crackers, melba toast, soda crackers, bagels (no egg), English muffins, fruits, and vegetables. These snacks are preferable to snack crackers, [place] fries, TORTILLA CHIPS, and POTATO chips. Popcorn should be air-popped or cooked in small amounts of liquid vegetable oil. Desserts Eat fruit, low-fat yogurt, and fruit ices instead of pastries, cake, and cookies. Sherbet, angel food cake, gelatin dessert, frozen low-fat yogurt, or other frozen products that do not contain saturated fat (pure fruit juice bars, frozen ice pops) are also acceptable.  COOKING METHODS Choose those methods that use little or no fat. They include: Poaching.  Braising.  Steaming.  Grilling.  Baking.  Stir-frying.  Broiling.  Microwaving.  Foods can be cooked in a nonstick pan without added fat, or use a nonfat cooking spray in regular cookware. Limit fried foods and avoid frying in saturated fat. Add moisture to lean meats by using water, broth, cooking wines, and other nonfat or low-fat sauces along with the cooking methods mentioned above. Soups and stews should be chilled after cooking. The fat that forms on top after a few hours in the refrigerator should be skimmed off. When preparing meals, avoid using excess salt. Salt can contribute to raising blood pressure in some people.  EATING AWAY FROM HOME Order entres, potatoes, and vegetables without  sauces or butter. When meat exceeds the size of a deck of cards (3 to 4 ounces), the rest can be taken home for another meal. Choose vegetable or fruit salads and ask for low-calorie salad dressings to be served on the side. Use dressings sparingly. Limit high-fat toppings, such as bacon, crumbled eggs, cheese, sunflower seeds, and olives. Ask for heart-healthy tub margarine instead of butter.

## 2012-07-08 ENCOUNTER — Telehealth: Payer: Self-pay | Admitting: Gastroenterology

## 2012-07-08 NOTE — Telephone Encounter (Signed)
PLEASE CALL PT. Her lab tests are normal.

## 2012-07-08 NOTE — Telephone Encounter (Signed)
LMOM that lab tests were normal.

## 2012-07-09 ENCOUNTER — Ambulatory Visit (HOSPITAL_COMMUNITY)
Admission: RE | Admit: 2012-07-09 | Discharge: 2012-07-09 | Disposition: A | Discharge status: Home / Self Care | Payer: BC Managed Care – PPO | Source: Ambulatory Visit | Attending: Gastroenterology | Admitting: Gastroenterology

## 2012-07-09 DIAGNOSIS — K7460 Unspecified cirrhosis of liver: Secondary | ICD-10-CM | POA: Insufficient documentation

## 2012-07-09 DIAGNOSIS — K754 Autoimmune hepatitis: Secondary | ICD-10-CM | POA: Insufficient documentation

## 2012-07-16 NOTE — Progress Notes (Signed)
Reminder in epic to follow up with SF in E30 in 6 months and labs in 6 months, Abd U/S every year

## 2012-07-17 ENCOUNTER — Telehealth: Payer: Self-pay | Admitting: Gastroenterology

## 2012-07-17 NOTE — Telephone Encounter (Signed)
Called patient TO DISCUSS RESULTS. NO SIGNIFICANT CHANGE SINCE 2011. REPEAT IN 1 YEAR.

## 2012-08-27 ENCOUNTER — Other Ambulatory Visit: Payer: Self-pay

## 2012-08-30 ENCOUNTER — Ambulatory Visit (HOSPITAL_BASED_OUTPATIENT_CLINIC_OR_DEPARTMENT_OTHER): Admit: 2012-08-30 | Payer: Self-pay | Admitting: Specialist

## 2012-08-30 ENCOUNTER — Encounter (HOSPITAL_BASED_OUTPATIENT_CLINIC_OR_DEPARTMENT_OTHER): Payer: Self-pay

## 2012-08-30 SURGERY — EXCISION MASS
Anesthesia: General | Laterality: Bilateral

## 2012-12-23 ENCOUNTER — Encounter: Payer: Self-pay | Admitting: *Deleted

## 2013-05-04 IMAGING — MG MM DIGITAL SCREENING BILAT
4 series · 4 of 4 positions shown · non-contrast
Comparison: None

CLINICAL DATA: Screening.

DIGITAL SCREENING MAMMOGRAM WITH CAD

[L CC]
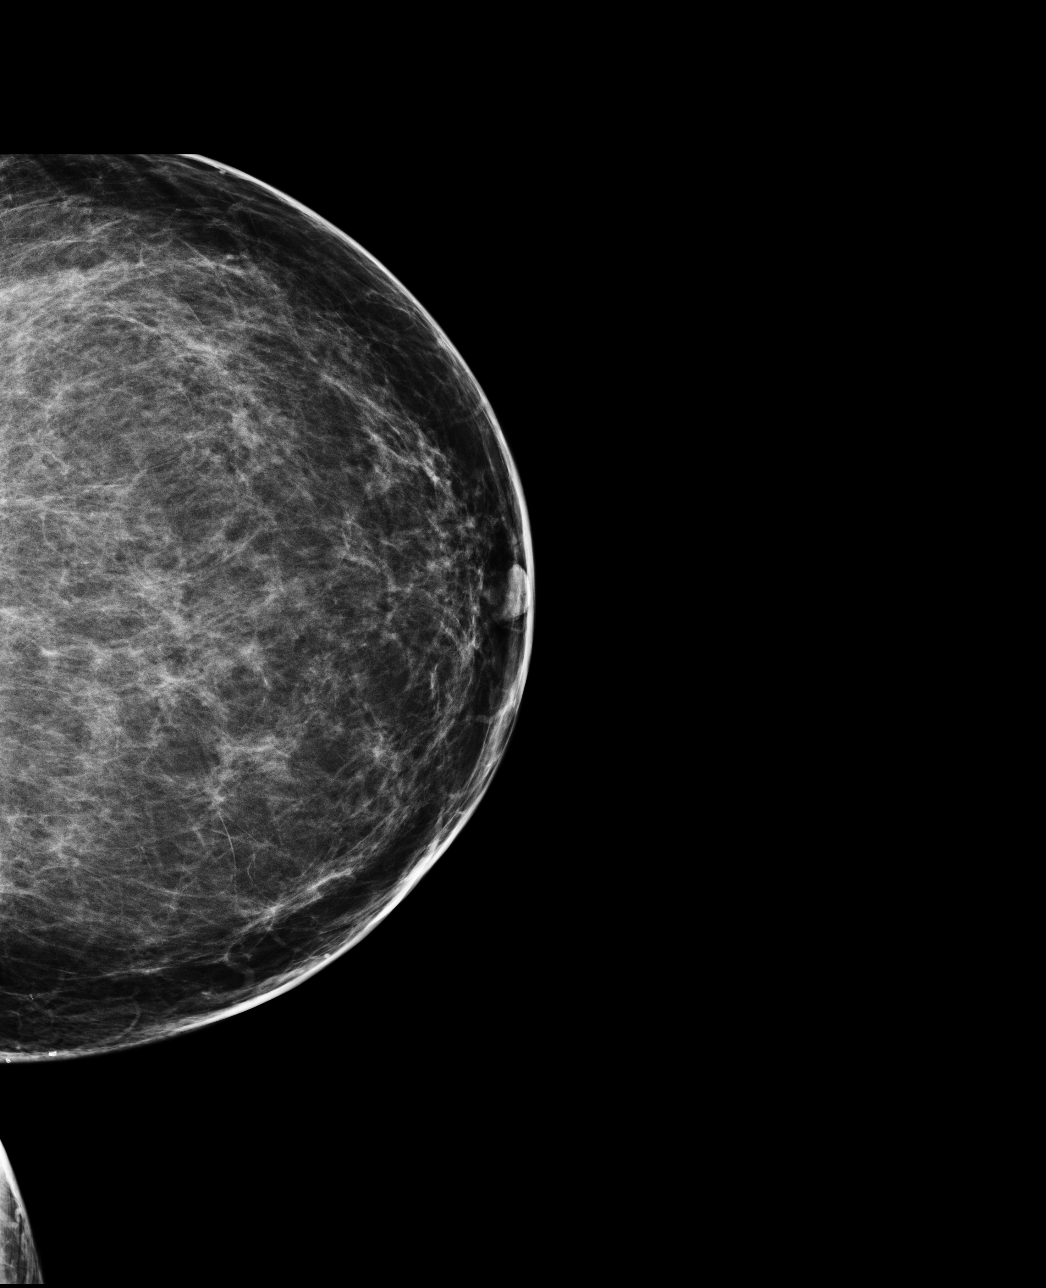

[L MLO]
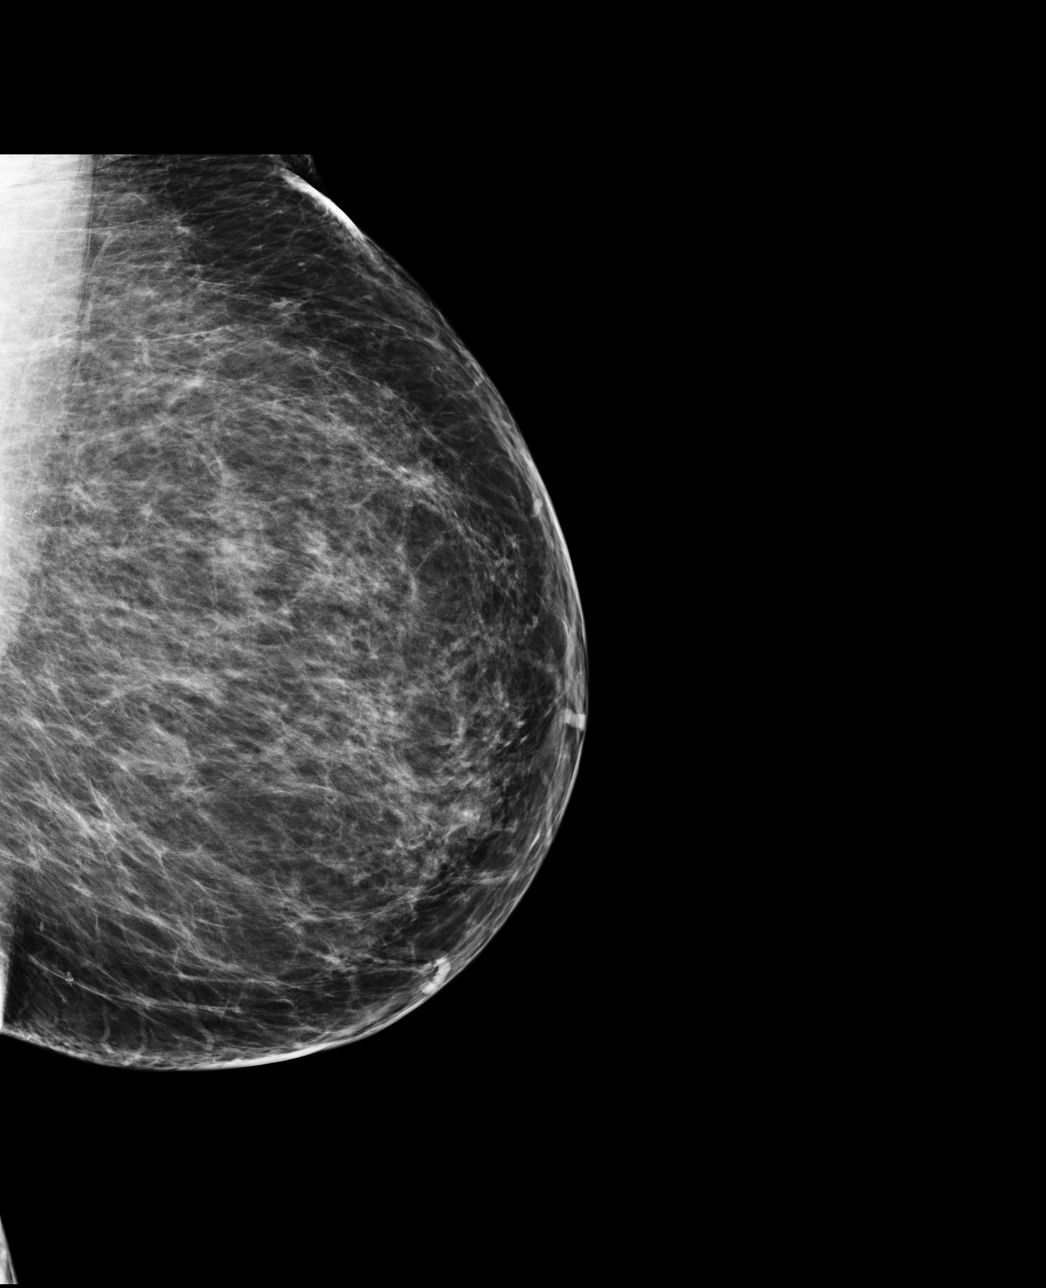

[R CC]
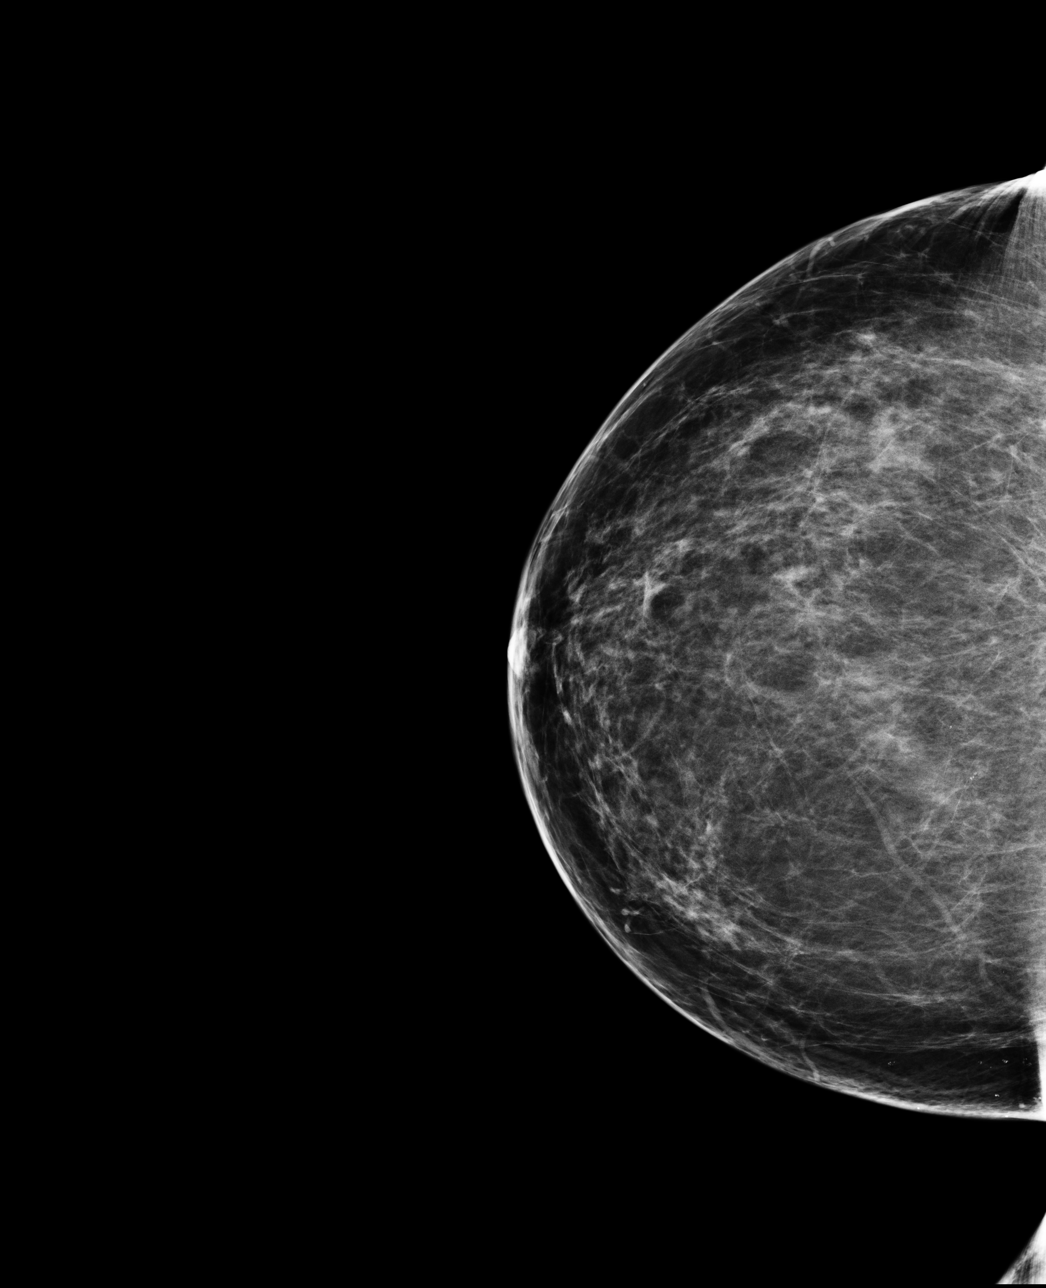

[R MLO]
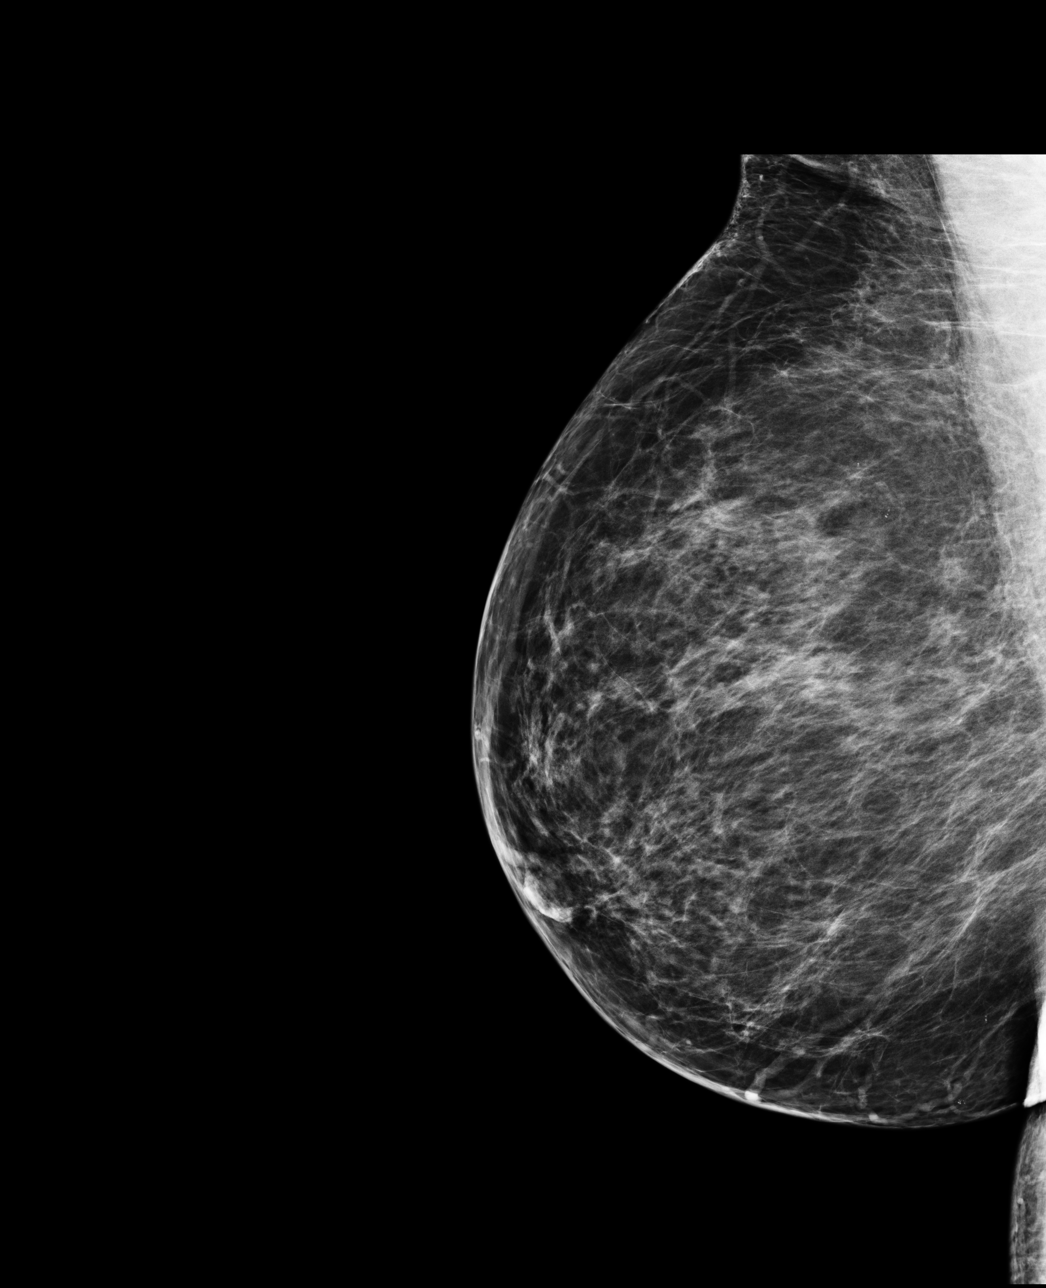

[4 of 4 positions shown; findings below may reference images not displayed]

FINDINGS: There are scattered fibroglandular densities. No
suspicious masses, architectural distortion, or calcifications are
present.

Images were processed with CAD.
IMPRESSION: No specific mammographic evidence of malignancy.

A result letter of this screening mammogram will be mailed directly
to the patient.

RECOMMENDATION:
Screening mammogram in one year. (Code:QL-T-AC5)

BI-RADS CATEGORY 1:  Negative

## 2013-06-02 ENCOUNTER — Telehealth: Payer: Self-pay | Admitting: Gastroenterology

## 2013-06-02 ENCOUNTER — Encounter: Payer: Self-pay | Admitting: Gastroenterology

## 2013-06-02 NOTE — Telephone Encounter (Signed)
Letter mailed to the patient

## 2013-06-02 NOTE — Telephone Encounter (Signed)
Message copied by Glendora Score on Thu Jun 02, 2013  3:56 PM ------      Message from: Tonye Pearson      Created: Thu Jun 02, 2013  3:37 PM       Pre recall list need abd [place]                  Thanks      Ginger ------

## 2013-07-12 ENCOUNTER — Other Ambulatory Visit (HOSPITAL_COMMUNITY): Payer: Self-pay | Admitting: Pulmonary Disease

## 2013-07-12 DIAGNOSIS — Z139 Encounter for screening, unspecified: Secondary | ICD-10-CM

## 2013-07-14 ENCOUNTER — Ambulatory Visit (HOSPITAL_COMMUNITY)
Admission: RE | Admit: 2013-07-14 | Discharge: 2013-07-14 | Disposition: A | Discharge status: Home / Self Care | Payer: Managed Care, Other (non HMO) | Source: Ambulatory Visit | Attending: Pulmonary Disease | Admitting: Pulmonary Disease

## 2013-07-14 DIAGNOSIS — Z1231 Encounter for screening mammogram for malignant neoplasm of breast: Secondary | ICD-10-CM | POA: Insufficient documentation

## 2013-07-14 DIAGNOSIS — Z139 Encounter for screening, unspecified: Secondary | ICD-10-CM

## 2013-07-20 IMAGING — US US ABDOMEN COMPLETE
1 series · 14 of 25 positions shown · non-contrast
Comparison: 12/20/2009.

CLINICAL DATA: Autoimmune hepatitis.  Cirrhosis of the liver.

COMPLETE ABDOMINAL ULTRASOUND

[Series 1: us abdomen complete · 0.26mm/px · 14 of 78 slices shown]
[im 1/78]
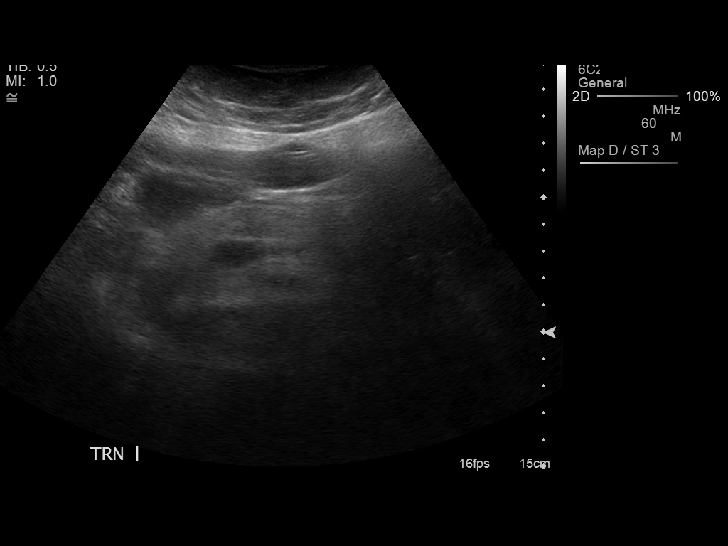
[im 7/78]
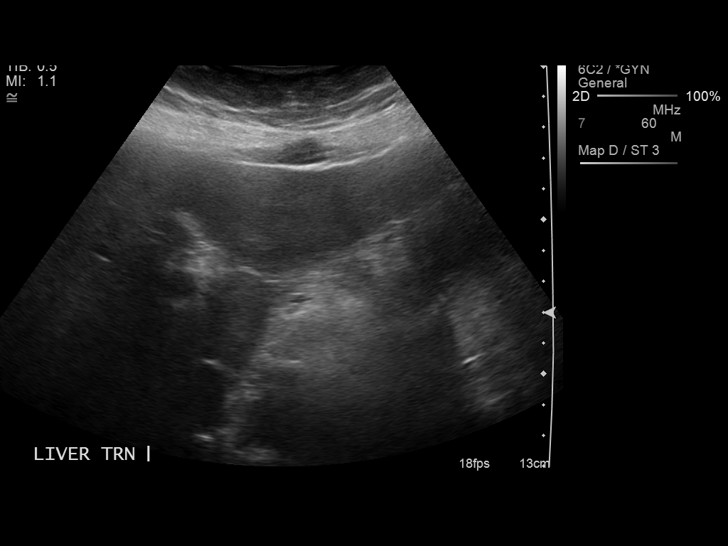
[im 13/78]
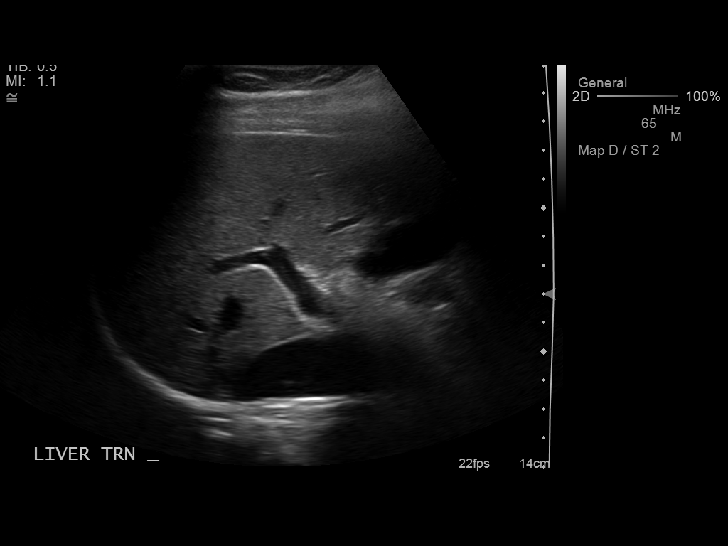
[im 20/78]
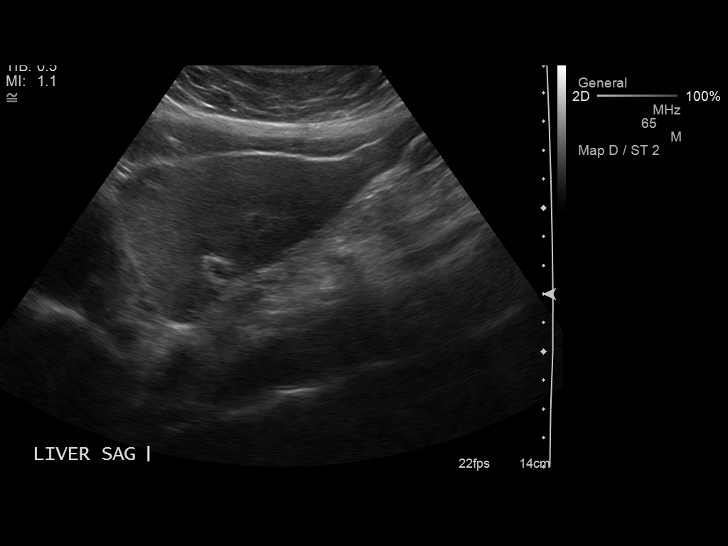
[im 26/78]
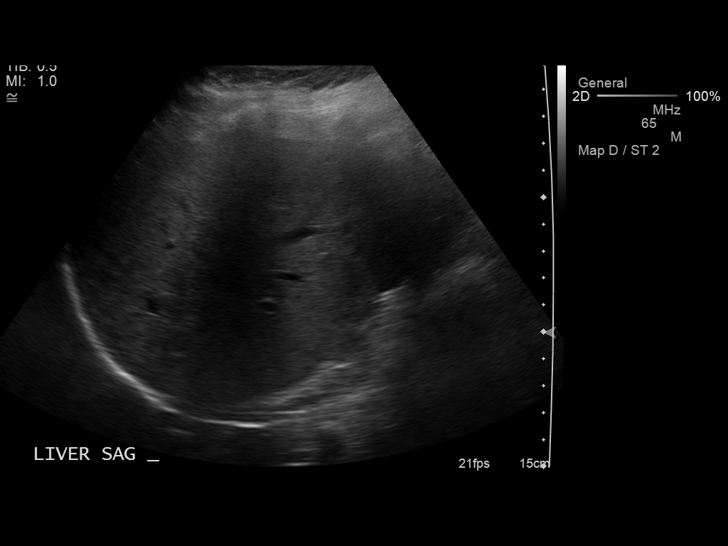
[im 29/78]
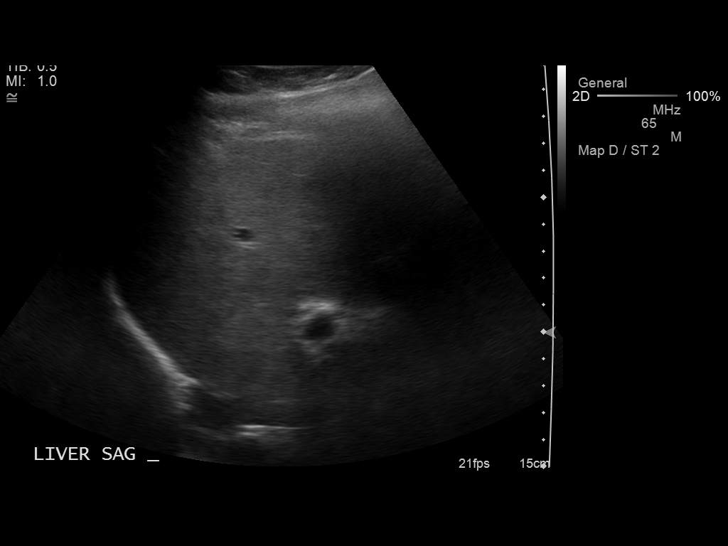
[im 36/78]
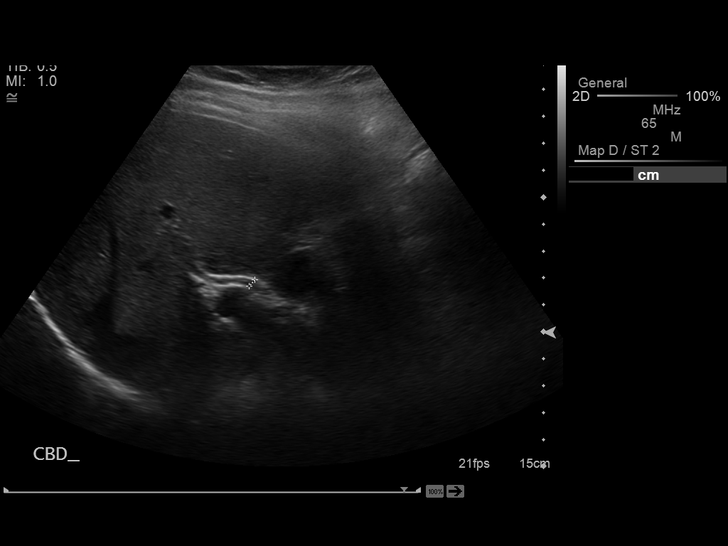
[im 42/78]
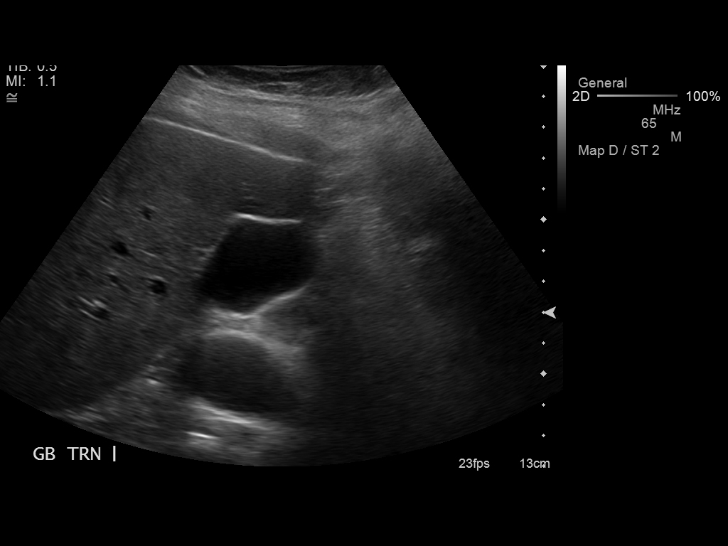
[im 49/78]
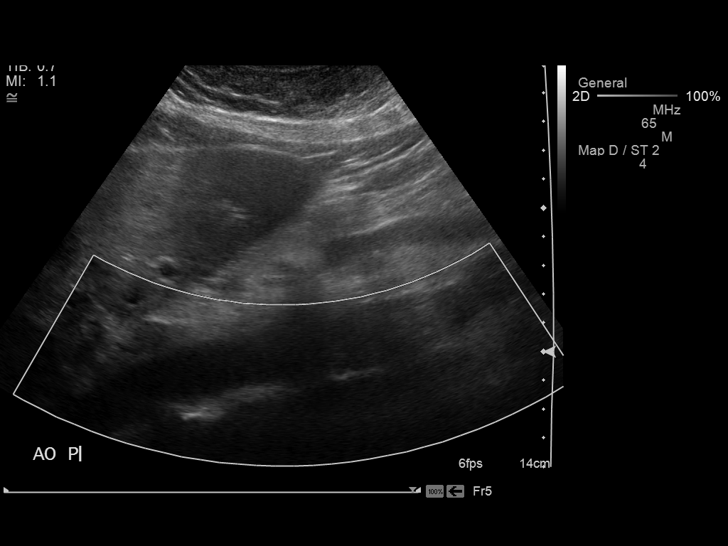
[im 52/78]
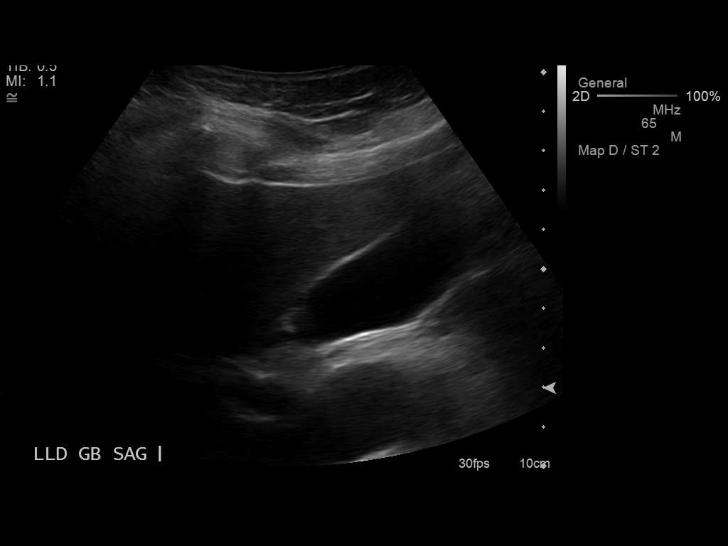
[im 58/78]
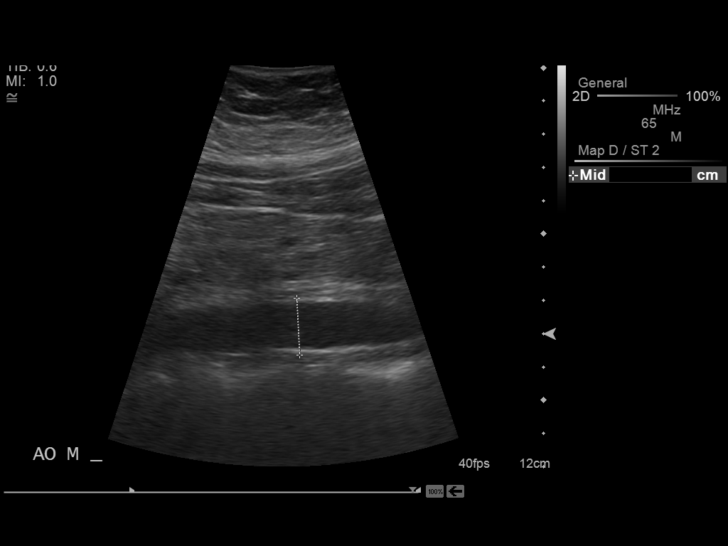
[im 65/78]
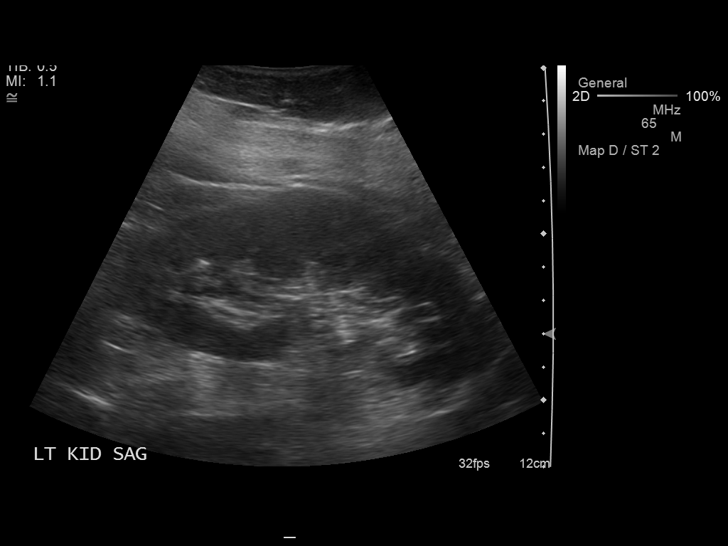
[im 71/78]
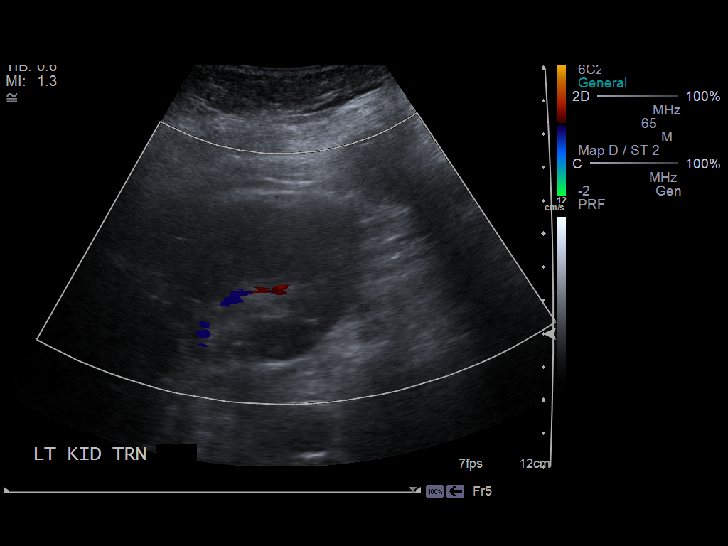
[im 78/78]
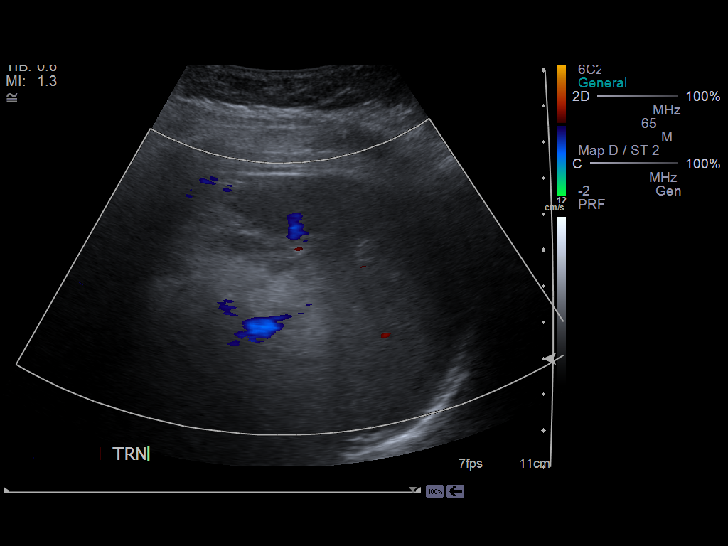

[14 of 25 positions shown; findings below may reference images not displayed]

FINDINGS: Gallbladder:  No gallstones, gallbladder wall thickening, or
pericholecystic fluid.

Common bile duct:  Normal in caliber, measuring 3.2 mm in diameter
proximally.

Liver:  Diffusely echogenic.  No visible mass.

IVC:  Appears normal.

Pancreas:  Poorly visualized tail.  The visualized portions appear
normal.

Spleen:  Normal, measuring 7.0 cm in length.

Right Kidney:  Not visualized.  Congenitally absent by previous
history.

Left Kidney:  Normal, measuring 11.7 cm in length.

Abdominal aorta:  No aneurysm identified.
IMPRESSION: 1.  Diffusely echogenic liver, compatible with the history of
cirrhosis and hepatitis.
2.  Non-visualized right kidney, congenitally absent by history.
3.  No acute abnormality.

## 2013-08-25 ENCOUNTER — Other Ambulatory Visit: Payer: Self-pay

## 2013-11-05 ENCOUNTER — Encounter (HOSPITAL_COMMUNITY): Payer: Self-pay | Admitting: Emergency Medicine

## 2013-11-05 ENCOUNTER — Emergency Department (HOSPITAL_COMMUNITY)
Admission: EM | Admit: 2013-11-05 | Discharge: 2013-11-06 | Disposition: A | Discharge status: Home / Self Care | Payer: Managed Care, Other (non HMO) | Attending: Emergency Medicine | Admitting: Emergency Medicine

## 2013-11-05 ENCOUNTER — Emergency Department (HOSPITAL_COMMUNITY): Payer: Managed Care, Other (non HMO)

## 2013-11-05 DIAGNOSIS — J029 Acute pharyngitis, unspecified: Secondary | ICD-10-CM | POA: Insufficient documentation

## 2013-11-05 DIAGNOSIS — E039 Hypothyroidism, unspecified: Secondary | ICD-10-CM | POA: Insufficient documentation

## 2013-11-05 DIAGNOSIS — J329 Chronic sinusitis, unspecified: Secondary | ICD-10-CM

## 2013-11-05 DIAGNOSIS — Y9389 Activity, other specified: Secondary | ICD-10-CM | POA: Insufficient documentation

## 2013-11-05 DIAGNOSIS — Z79899 Other long term (current) drug therapy: Secondary | ICD-10-CM | POA: Insufficient documentation

## 2013-11-05 DIAGNOSIS — R5381 Other malaise: Secondary | ICD-10-CM | POA: Insufficient documentation

## 2013-11-05 DIAGNOSIS — IMO0002 Reserved for concepts with insufficient information to code with codable children: Secondary | ICD-10-CM

## 2013-11-05 DIAGNOSIS — Y92009 Unspecified place in unspecified non-institutional (private) residence as the place of occurrence of the external cause: Secondary | ICD-10-CM | POA: Insufficient documentation

## 2013-11-05 DIAGNOSIS — E860 Dehydration: Secondary | ICD-10-CM | POA: Insufficient documentation

## 2013-11-05 DIAGNOSIS — R296 Repeated falls: Secondary | ICD-10-CM | POA: Insufficient documentation

## 2013-11-05 DIAGNOSIS — J45909 Unspecified asthma, uncomplicated: Secondary | ICD-10-CM | POA: Insufficient documentation

## 2013-11-05 DIAGNOSIS — R5383 Other fatigue: Secondary | ICD-10-CM

## 2013-11-05 DIAGNOSIS — R55 Syncope and collapse: Secondary | ICD-10-CM | POA: Insufficient documentation

## 2013-11-05 DIAGNOSIS — S0180XA Unspecified open wound of other part of head, initial encounter: Secondary | ICD-10-CM | POA: Insufficient documentation

## 2013-11-05 MED ORDER — SODIUM CHLORIDE 0.9 % IV BOLUS (SEPSIS)
1000.0000 mL | Freq: Once | INTRAVENOUS | Status: AC
  Administered 2013-11-06: 1000 mL via INTRAVENOUS

## 2013-11-05 MED ORDER — ALBUTEROL SULFATE (2.5 MG/3ML) 0.083% IN NEBU
5.0000 mg | INHALATION_SOLUTION | Freq: Once | RESPIRATORY_TRACT | Status: AC
  Administered 2013-11-06: 5 mg via RESPIRATORY_TRACT
  Filled 2013-11-05: qty 6

## 2013-11-05 MED ORDER — PREDNISONE 50 MG PO TABS
60.0000 mg | ORAL_TABLET | Freq: Once | ORAL | Status: AC
  Administered 2013-11-06: 60 mg via ORAL
  Filled 2013-11-05 (×2): qty 1

## 2013-11-05 NOTE — ED Provider Notes (Signed)
CSN: [id number]     Arrival date & time 11/05/13  2335 History  This chart was scribed for Teressa Lower, MD by Jenne Campus, ED Scribe. This patient was seen in room APA18/APA18 and the patient's care was started at 11:40 PM.   Chief Complaint  Patient presents with  . Head Laceration   Patient is a 53 y.o. female presenting with scalp laceration. The history is provided by the patient. No language interpreter was used.  Head Laceration This is a new problem. The current episode started 1 to 2 hours ago. The problem occurs constantly. The problem has not changed since onset.Pertinent negatives include no chest pain and no shortness of breath. Nothing aggravates the symptoms. Nothing relieves the symptoms. Treatments tried: pressure. The treatment provided mild relief.    HPI Comments: LAWRENCIA MAUNEY is a 53 y.o. female who presents to the Emergency Department complaining of a head laceration that occurred when she got up to take a shower tonight around 10:50 PM tonight, became dizzy and fell into her bathtub. The bleeding is controlled currently and she denies any LOC. Pt states that she has been battling "cold like symptoms" for the past week and has just felt fatigued. She states that she took one dose of Robitussin and 2 Benadryl PMs around 7 AM this morning and laid in bed the rest of the day. Her attempt to get up to take a shower was the first time that she had tried to be active today. She denies any other complaints including neck or back pain. She has a h/o hypothyroidism and asthma treated with an Advair inhaler. She denies being on any anticoagulants or ASA.   Past Medical History  Diagnosis Date  . Asthma   . Hypothyroidism    Past Surgical History  Procedure Laterality Date  . Right great toe    . Colonoscopy  05/10/2012    Polyp in the ascending colon/Internal hemorrhoids   Family History  Problem Relation Age of Onset  . Colon cancer Neg Hx    History  Substance Use  Topics  . Smoking status: Never Smoker   . Smokeless tobacco: Not on file  . Alcohol Use: No   No OB history provided.  Review of Systems  Constitutional: Positive for fatigue.  HENT: Positive for sore throat.   Respiratory: Positive for cough. Negative for shortness of breath.   Cardiovascular: Negative for chest pain.  Genitourinary: Negative for dysuria, urgency, frequency and flank pain.  Musculoskeletal: Negative for back pain and neck pain.  Skin: Positive for wound.  Neurological: Positive for dizziness.  All other systems reviewed and are negative.    Allergies  Ibuprofen  Home Medications   Current Outpatient Rx  Name  Route  Sig  Dispense  Refill  . acetaminophen (TYLENOL) 500 MG tablet   Oral   Take 1,000 mg by mouth every 6 (six) hours as needed. For pain         . Calcium Carbonate-Vitamin D (CALCIUM + D PO)   Oral   Take 1 tablet by mouth daily.         . Fluticasone-Salmeterol (ADVAIR) 100-50 MCG/DOSE AEPB   Inhalation   Inhale 1 puff into the lungs every 12 (twelve) hours.         Marland Kitchen levothyroxine (SYNTHROID, LEVOTHROID) 112 MCG tablet   Oral   Take 112 mcg by mouth daily.         Mable Fill (NAPHCON-A OP)   Ophthalmic  Apply 1 drop to eye as needed. For dry eyes          Triage vitals: BP 88/42  Pulse 117  Temp(Src) 100.2 F (37.9 C) (Oral)  Resp 20  Ht 5\' 4"  (1.626 m)  Wt 185 lb (83.915 kg)  BMI 31.74 kg/m2  SpO2 96%  Physical Exam  Nursing note and vitals reviewed. Constitutional: She is oriented to person, place, and time. She appears well-developed and well-nourished. No distress.  HENT:  Head: Normocephalic.  Mouth/Throat: Oropharynx is clear and moist.  TMs are clear bilaterally. No tender dentition. No epistaxis.  5 cm curved with linear laceration of full thickness to the right forehead that involves the right eyebrow  Eyes: EOM are normal.  Neck: Neck supple. No tracheal deviation present.  No  cervical spine tenderness or deformity  Cardiovascular: Normal rate and regular rhythm.   Pulmonary/Chest: Effort normal. No respiratory distress. Wheezes: intermittent expiratory wheezes.  Mild intermittent expiratory wheezes  Abdominal: Soft. Bowel sounds are normal. She exhibits no distension. There is no tenderness.  Musculoskeletal: Normal range of motion. She exhibits no edema and no tenderness.  No cervical, thoracic or lumbar tenderness   Neurological: She is alert and oriented to person, place, and time. She has normal reflexes. No cranial nerve deficit. Coordination normal.  Skin: Skin is warm and dry.  Psychiatric: She has a normal mood and affect. Her behavior is normal.    ED Course  LACERATION REPAIR Date/Time: 11/06/2013 2:02 AM Performed by: Teressa Lower Authorized by: Teressa Lower Consent: Verbal consent obtained. Risks and benefits: risks, benefits and alternatives were discussed Consent given by: patient Patient understanding: patient states understanding of the procedure being performed Patient consent: the patient's understanding of the procedure matches consent given Procedure consent: procedure consent matches procedure scheduled Required items: required blood products, implants, devices, and special equipment available Patient identity confirmed: verbally with patient Time out: Immediately prior to procedure a "time out" was called to verify the correct patient, procedure, equipment, support staff and site/side marked as required. Body area: head/neck Location details: forehead Laceration length: 5 cm Foreign bodies: no foreign bodies Anesthesia: local infiltration Local anesthetic: lidocaine 1% with epinephrine Anesthetic total: 4 ml Preparation: Patient was prepped and draped in the usual sterile fashion. Irrigation solution: saline Irrigation method: syringe Amount of cleaning: standard Debridement: none Skin closure: 5-0 nylon Number of sutures:  7 Technique: simple Approximation: close Approximation difficulty: simple Dressing: antibiotic ointment Patient tolerance: Patient tolerated the procedure well with no immediate complications.   (including critical care time)  Medications  sodium chloride 0.9 % bolus 1,000 mL (not administered)  albuterol (PROVENTIL) (2.5 MG/3ML) 0.083% nebulizer solution 5 mg (not administered)  predniSONE (DELTASONE) tablet 60 mg (not administered)    DIAGNOSTIC STUDIES: Oxygen Saturation is 96% on RA, adequate by my interpretation.    COORDINATION OF CARE: 11:45 PM-Discussed treatment plan which includes laceration repair and IV fluids with pt at bedside and pt agreed to plan.   Labs Review Labs Reviewed  BASIC METABOLIC PANEL - Abnormal; Notable for the following:    Sodium 133 (*)    Glucose, Bld 130 (*)    Creatinine, Ser 1.38 (*)    GFR calc non Af Amer 43 (*)    GFR calc Af Amer 50 (*)    All other components within normal limits  CBC   Imaging Review Ct Head Wo Contrast  11/06/2013   CLINICAL DATA:  Dizziness; status post fall. Hit forehead on tub.  Loss of consciousness.  EXAM: CT HEAD WITHOUT CONTRAST  TECHNIQUE: Contiguous axial images were obtained from the base of the skull through the vertex without intravenous contrast.  COMPARISON:  CT of the cervical spine performed 12/26/2004  FINDINGS: There is no evidence of acute infarction, mass lesion, or intra- or extra-axial hemorrhage on CT.  The posterior fossa, including the cerebellum, brainstem and fourth ventricle, is within normal limits. The third and lateral ventricles, and basal ganglia are unremarkable in appearance. The cerebral hemispheres are symmetric in appearance, with normal gray-white differentiation. No mass effect or midline shift is seen.  There is no evidence of fracture; visualized osseous structures are unremarkable in appearance. The orbits are within normal limits. The visualized portions of the maxillary sinuses  are completely opacified, and there is partial opacification of the frontal sinuses, right side of the sphenoid sinus and ethmoid air cells. There is mild partial opacification of the right mastoid air cells. The remaining paranasal sinuses and left mastoid air cells are well-aerated. Soft tissue laceration is noted overlying the frontal calvarium, with associated soft tissue swelling.  IMPRESSION: 1. No evidence of traumatic intracranial injury or fracture. 2. Soft tissue laceration overlying the frontal calvarium, with associated soft tissue swelling. 3. Complete opacification of the visualized portions of the maxillary sinuses, with partial opacification of the frontal sinuses, right side of the sphenoid sinus and ethmoid air cells. Mild partial opacification of the right mastoid air cells.   Electronically Signed   By: Garald Balding M.D.   On: 11/06/2013 01:21    EKG Interpretation    Date/Time:  Saturday November 05 2013 23:56:29 EST Ventricular Rate:  88 PR Interval:  138 QRS Duration: 62 QT Interval:  368 QTC Calculation: 445 R Axis:   5 Text Interpretation:  Normal sinus rhythm Nonspecific ST abnormality No previous ECGs available Confirmed by Rami Budhu  MD, Frantz Quattrone [phone]) on 11/06/2013 12:16:18 AM           IV fluids provided. IV fentanyl and Zofran for head pain. Tylenol for low-grade fever. Albuterol for wheezes. Recheck lungs sounds after breathing treatment clear bilaterally with good air movement.   Wound repaired after CT scan reviewed.  Orthostatic with standing and vital signs reflect the same. Given 2 L of fluids, able walk to the bathroom and is making urine. She is still mildly tachycardic when she gets up. Her creatinine is mildly elevated and clinically she looks dehydrated. She very much does not want to be admitted to the hospital. She is tolerating by mouth fluids. Plan 1 more liter of normal saline and reassess.  2:54 AM after 3L IVFs HR 70s and SBP 116.  Ambulates  without dizziness  Plan d/c home, follow up 5 days for SR.  ABx ointment to wound BID, will keep clean and dry. Augmentin prescribed for sinusitis. Dehydration precautions provided. Infection precautions provided. Work note will be provided for 2 days. Patient agrees to rest and plenty of fluids. She agrees to strict return precautions. MDM  Diagnosis: Syncope, head laceration, sinusitis, dehydration  Evaluated with EKG, labs and imaging reviewed as above. She does have mildly elevated creatinine. Her CT scan does not show any fracture or or intracranial bleed, but she does have diffuse sinus opacification and will be treated for sinusitis. She has already been taking Afrin at home. She was instructed to use over-the-counter saline nasal spray. Symptomatically improved with IV narcotics and IV fluids. Vital signs nurse's notes reviewed and considered.   I personally performed  the services described in this documentation, which was scribed in my presence. The recorded information has been reviewed and is accurate.    Teressa Lower, MD 11/06/13 0300

## 2013-11-05 NOTE — ED Notes (Signed)
Pt reports "I blacked out and fell into the bathtub. When I woke up I was bleeding." Reports feeling dizzy with cold symptoms x 1 week.

## 2013-11-06 LAB — BASIC METABOLIC PANEL
BUN: 17 mg/dL (ref 6–23)
CALCIUM: 8.6 mg/dL (ref 8.4–10.5)
CO2: 25 mEq/L (ref 19–32)
Chloride: 97 mEq/L (ref 96–112)
Creatinine, Ser: 1.38 mg/dL — ABNORMAL HIGH (ref 0.50–1.10)
GFR calc Af Amer: 50 mL/min — ABNORMAL LOW (ref >90)
GFR, EST NON AFRICAN AMERICAN: 43 mL/min — AB (ref >90)
GLUCOSE: 130 mg/dL — AB (ref 70–99)
Potassium: 4.5 mEq/L (ref 3.7–5.3)
Sodium: 133 mEq/L — ABNORMAL LOW (ref 137–147)

## 2013-11-06 LAB — CBC
HEMATOCRIT: 39.7 % (ref 36.0–46.0)
HEMOGLOBIN: 14.1 g/dL (ref 12.0–15.0)
MCH: 33 pg (ref 26.0–34.0)
MCHC: 35.5 g/dL (ref 30.0–36.0)
MCV: 93 fL (ref 78.0–100.0)
Platelets: 241 10*3/uL (ref 150–400)
RBC: 4.27 MIL/uL (ref 3.87–5.11)
RDW: 15.2 % (ref 11.5–15.5)
WBC: 10.4 10*3/uL (ref 4.0–10.5)

## 2013-11-06 MED ORDER — BACITRACIN ZINC 500 UNIT/GM EX OINT
TOPICAL_OINTMENT | CUTANEOUS | Status: AC
  Filled 2013-11-06: qty 0.9

## 2013-11-06 MED ORDER — SODIUM CHLORIDE 0.9 % IV BOLUS (SEPSIS)
1000.0000 mL | Freq: Once | INTRAVENOUS | Status: AC
  Administered 2013-11-06: 1000 mL via INTRAVENOUS

## 2013-11-06 MED ORDER — ALBUTEROL SULFATE HFA 108 (90 BASE) MCG/ACT IN AERS
INHALATION_SPRAY | RESPIRATORY_TRACT | Status: AC
  Administered 2013-11-06: 2 via RESPIRATORY_TRACT
  Filled 2013-11-06: qty 6.7

## 2013-11-06 MED ORDER — FENTANYL CITRATE 0.05 MG/ML IJ SOLN
50.0000 ug | INTRAMUSCULAR | Status: DC | PRN
  Administered 2013-11-06: 50 ug via INTRAVENOUS
  Filled 2013-11-06: qty 2

## 2013-11-06 MED ORDER — TRAMADOL HCL 50 MG PO TABS: 50.0000 mg | ORAL_TABLET | Freq: Two times a day (BID) | ORAL | Status: DC | PRN

## 2013-11-06 MED ORDER — ONDANSETRON HCL 4 MG/2ML IJ SOLN
4.0000 mg | Freq: Once | INTRAMUSCULAR | Status: AC
  Administered 2013-11-06: 4 mg via INTRAVENOUS
  Filled 2013-11-06: qty 2

## 2013-11-06 MED ORDER — AMOXICILLIN-POT CLAVULANATE 875-125 MG PO TABS: 1.0000 | ORAL_TABLET | Freq: Two times a day (BID) | ORAL | Status: DC

## 2013-11-06 MED ORDER — ACETAMINOPHEN 325 MG PO TABS
650.0000 mg | ORAL_TABLET | Freq: Once | ORAL | Status: AC
  Administered 2013-11-06: 650 mg via ORAL
  Filled 2013-11-06: qty 2

## 2013-11-06 NOTE — Discharge Instructions (Signed)
Syncope  Syncope is a fainting spell. This means the person loses consciousness and drops to the ground. The person is generally unconscious for less than 5 minutes. The person may have some muscle twitches for up to 15 seconds before waking up and returning to normal. Syncope occurs more often in elderly people, but it can happen to anyone. While most causes of syncope are not dangerous, syncope can be a sign of a serious medical problem. It is important to seek medical care.   CAUSES   Syncope is caused by a sudden decrease in blood flow to the brain. The specific cause is often not determined. Factors that can trigger syncope include:   Taking medicines that lower blood pressure.   Sudden changes in posture, such as standing up suddenly.   Taking more medicine than prescribed.   Standing in one place for too long.   Seizure disorders.   Dehydration and excessive exposure to heat.   Low blood sugar (hypoglycemia).   Straining to have a bowel movement.   Heart disease, irregular heartbeat, or other circulatory problems.   Fear, emotional distress, seeing blood, or severe pain.  SYMPTOMS   Right before fainting, you may:   Feel dizzy or lightheaded.   Feel nauseous.   See all white or all black in your field of vision.   Have cold, clammy skin.  DIAGNOSIS   Your caregiver will ask about your symptoms, perform a physical exam, and perform electrocardiography (ECG) to record the electrical activity of your heart. Your caregiver may also perform other heart or blood tests to determine the cause of your syncope.  TREATMENT   In most cases, no treatment is needed. Depending on the cause of your syncope, your caregiver may recommend changing or stopping some of your medicines.  HOME CARE INSTRUCTIONS   Have someone stay with you until you feel stable.   Do not drive, operate machinery, or play sports until your caregiver says it is okay.   Keep all follow-up appointments as directed by your  caregiver.   Lie down right away if you start feeling like you might faint. Breathe deeply and steadily. Wait until all the symptoms have passed.   Drink enough fluids to keep your urine clear or pale yellow.   If you are taking blood pressure or heart medicine, get up slowly, taking several minutes to sit and then stand. This can reduce dizziness.  SEEK IMMEDIATE MEDICAL CARE IF:    You have a severe headache.   You have unusual pain in the chest, abdomen, or back.   You are bleeding from the mouth or rectum, or you have black or tarry stool.   You have an irregular or very fast heartbeat.   You have pain with breathing.   You have repeated fainting or seizure-like jerking during an episode.   You faint when sitting or lying down.   You have confusion.   You have difficulty walking.   You have severe weakness.   You have vision problems.  If you fainted, call your local emergency services (911 in [place] Do not drive yourself to the hospital.   MAKE SURE YOU:   Understand these instructions.   Will watch your condition.   Will get help right away if you are not doing well or get worse.  Document Released: 10/06/2005 Document Revised: 04/06/2012 Document Reviewed: 12/05/2011  [employer] Patient Information 2014 [employer].

## 2013-11-06 NOTE — ED Notes (Signed)
Dr Marnette Burgess commenced suturing

## 2013-11-06 NOTE — ED Notes (Signed)
Remains fully alert and oriented. Forehead pain eased s/p fentanyl

## 2013-11-06 NOTE — ED Notes (Signed)
Up to bathroom, tolerated walk well. Feels much better. VSS. readys for discharge

## 2013-11-06 NOTE — ED Notes (Signed)
Drinking water well. No nausea

## 2013-11-06 NOTE — ED Notes (Signed)
EKG done

## 2014-08-28 ENCOUNTER — Other Ambulatory Visit (HOSPITAL_COMMUNITY): Payer: Self-pay | Admitting: Pulmonary Disease

## 2014-08-28 DIAGNOSIS — Z1231 Encounter for screening mammogram for malignant neoplasm of breast: Secondary | ICD-10-CM

## 2014-09-04 ENCOUNTER — Ambulatory Visit (HOSPITAL_COMMUNITY)
Admission: RE | Admit: 2014-09-04 | Discharge: 2014-09-04 | Disposition: A | Discharge status: Home / Self Care | Payer: Managed Care, Other (non HMO) | Source: Ambulatory Visit | Attending: Pulmonary Disease | Admitting: Pulmonary Disease

## 2014-09-04 DIAGNOSIS — Z1231 Encounter for screening mammogram for malignant neoplasm of breast: Secondary | ICD-10-CM

## 2014-11-02 ENCOUNTER — Ambulatory Visit (INDEPENDENT_AMBULATORY_CARE_PROVIDER_SITE_OTHER): Payer: Managed Care, Other (non HMO) | Admitting: Otolaryngology

## 2014-11-02 DIAGNOSIS — R430 Anosmia: Secondary | ICD-10-CM

## 2014-11-02 DIAGNOSIS — J330 Polyp of nasal cavity: Secondary | ICD-10-CM

## 2014-11-23 ENCOUNTER — Ambulatory Visit (INDEPENDENT_AMBULATORY_CARE_PROVIDER_SITE_OTHER): Payer: Managed Care, Other (non HMO) | Admitting: Otolaryngology

## 2014-11-23 DIAGNOSIS — J330 Polyp of nasal cavity: Secondary | ICD-10-CM

## 2014-11-23 DIAGNOSIS — J310 Chronic rhinitis: Secondary | ICD-10-CM

## 2014-11-23 DIAGNOSIS — R430 Anosmia: Secondary | ICD-10-CM

## 2015-02-22 ENCOUNTER — Ambulatory Visit (INDEPENDENT_AMBULATORY_CARE_PROVIDER_SITE_OTHER): Payer: Managed Care, Other (non HMO) | Admitting: Otolaryngology

## 2015-02-22 DIAGNOSIS — J330 Polyp of nasal cavity: Secondary | ICD-10-CM

## 2015-02-22 DIAGNOSIS — R430 Anosmia: Secondary | ICD-10-CM | POA: Diagnosis not present

## 2015-03-15 ENCOUNTER — Ambulatory Visit (INDEPENDENT_AMBULATORY_CARE_PROVIDER_SITE_OTHER): Payer: Managed Care, Other (non HMO) | Admitting: Otolaryngology

## 2015-03-15 DIAGNOSIS — J330 Polyp of nasal cavity: Secondary | ICD-10-CM

## 2015-03-15 DIAGNOSIS — R430 Anosmia: Secondary | ICD-10-CM

## 2015-03-20 ENCOUNTER — Other Ambulatory Visit (INDEPENDENT_AMBULATORY_CARE_PROVIDER_SITE_OTHER): Payer: Self-pay | Admitting: Otolaryngology

## 2015-03-20 DIAGNOSIS — J320 Chronic maxillary sinusitis: Secondary | ICD-10-CM

## 2015-03-21 ENCOUNTER — Other Ambulatory Visit (INDEPENDENT_AMBULATORY_CARE_PROVIDER_SITE_OTHER): Payer: Self-pay | Admitting: Otolaryngology

## 2015-03-23 ENCOUNTER — Other Ambulatory Visit (HOSPITAL_COMMUNITY): Payer: Managed Care, Other (non HMO)

## 2015-03-30 ENCOUNTER — Ambulatory Visit (HOSPITAL_COMMUNITY)
Admission: RE | Admit: 2015-03-30 | Discharge: 2015-03-30 | Disposition: A | Discharge status: Home / Self Care | Payer: Managed Care, Other (non HMO) | Source: Ambulatory Visit | Attending: Otolaryngology | Admitting: Otolaryngology

## 2015-03-30 DIAGNOSIS — J320 Chronic maxillary sinusitis: Secondary | ICD-10-CM | POA: Diagnosis not present

## 2015-04-05 ENCOUNTER — Ambulatory Visit (INDEPENDENT_AMBULATORY_CARE_PROVIDER_SITE_OTHER): Payer: Managed Care, Other (non HMO) | Admitting: Otolaryngology

## 2015-04-05 DIAGNOSIS — J322 Chronic ethmoidal sinusitis: Secondary | ICD-10-CM

## 2015-04-05 DIAGNOSIS — J320 Chronic maxillary sinusitis: Secondary | ICD-10-CM

## 2015-04-05 DIAGNOSIS — J330 Polyp of nasal cavity: Secondary | ICD-10-CM | POA: Diagnosis not present

## 2015-04-10 ENCOUNTER — Other Ambulatory Visit: Payer: Self-pay | Admitting: Otolaryngology

## 2015-05-10 ENCOUNTER — Encounter (HOSPITAL_BASED_OUTPATIENT_CLINIC_OR_DEPARTMENT_OTHER): Payer: Self-pay | Admitting: *Deleted

## 2015-05-15 ENCOUNTER — Encounter (HOSPITAL_BASED_OUTPATIENT_CLINIC_OR_DEPARTMENT_OTHER)
Admission: RE | Disposition: A | Discharge status: Home / Self Care | Payer: Self-pay | Source: Ambulatory Visit | Attending: Otolaryngology

## 2015-05-15 ENCOUNTER — Ambulatory Visit (HOSPITAL_BASED_OUTPATIENT_CLINIC_OR_DEPARTMENT_OTHER): Payer: Managed Care, Other (non HMO) | Admitting: Anesthesiology

## 2015-05-15 ENCOUNTER — Encounter (HOSPITAL_BASED_OUTPATIENT_CLINIC_OR_DEPARTMENT_OTHER): Payer: Self-pay | Admitting: *Deleted

## 2015-05-15 ENCOUNTER — Ambulatory Visit (HOSPITAL_BASED_OUTPATIENT_CLINIC_OR_DEPARTMENT_OTHER)
Admission: RE | Admit: 2015-05-15 | Discharge: 2015-05-15 | Disposition: A | Discharge status: Home / Self Care | Payer: Managed Care, Other (non HMO) | Source: Ambulatory Visit | Attending: Otolaryngology | Admitting: Otolaryngology

## 2015-05-15 DIAGNOSIS — J45909 Unspecified asthma, uncomplicated: Secondary | ICD-10-CM | POA: Diagnosis not present

## 2015-05-15 DIAGNOSIS — J338 Other polyp of sinus: Secondary | ICD-10-CM | POA: Insufficient documentation

## 2015-05-15 DIAGNOSIS — J320 Chronic maxillary sinusitis: Secondary | ICD-10-CM | POA: Diagnosis not present

## 2015-05-15 DIAGNOSIS — E039 Hypothyroidism, unspecified: Secondary | ICD-10-CM | POA: Diagnosis not present

## 2015-05-15 DIAGNOSIS — J322 Chronic ethmoidal sinusitis: Secondary | ICD-10-CM | POA: Diagnosis not present

## 2015-05-15 DIAGNOSIS — B488 Other specified mycoses: Secondary | ICD-10-CM | POA: Insufficient documentation

## 2015-05-15 HISTORY — PX: SINUS ENDO WITH FUSION: SHX5329

## 2015-05-15 LAB — POCT HEMOGLOBIN-HEMACUE: Hemoglobin: 12.8 g/dL (ref 12.0–15.0)

## 2015-05-15 SURGERY — SURGERY, PARANASAL SINUS, ENDOSCOPIC, WITH NASAL SEPTOPLASTY, TURBINOPLASTY, AND MAXILLARY SINUSOTOMY
Anesthesia: General | Laterality: Bilateral

## 2015-05-15 MED ORDER — OXYCODONE HCL 5 MG/5ML PO SOLN: 5.0000 mg | Freq: Once | ORAL | Status: DC | PRN

## 2015-05-15 MED ORDER — SUCCINYLCHOLINE CHLORIDE 20 MG/ML IJ SOLN
INTRAMUSCULAR | Status: DC | PRN
  Administered 2015-05-15: 100 mg via INTRAVENOUS

## 2015-05-15 MED ORDER — GLYCOPYRROLATE 0.2 MG/ML IJ SOLN: 0.2000 mg | Freq: Once | INTRAMUSCULAR | Status: DC | PRN

## 2015-05-15 MED ORDER — OXYCODONE-ACETAMINOPHEN 5-325 MG PO TABS: 1.0000 | ORAL_TABLET | ORAL | Status: AC | PRN

## 2015-05-15 MED ORDER — LIDOCAINE HCL (CARDIAC) 10 MG/ML IV SOLN
INTRAVENOUS | Status: DC | PRN
  Administered 2015-05-15: 60 mg via INTRAVENOUS

## 2015-05-15 MED ORDER — HYDROMORPHONE HCL 1 MG/ML IJ SOLN: 0.2500 mg | INTRAMUSCULAR | Status: DC | PRN

## 2015-05-15 MED ORDER — MIDAZOLAM HCL 2 MG/2ML IJ SOLN
INTRAMUSCULAR | Status: AC
  Filled 2015-05-15: qty 2

## 2015-05-15 MED ORDER — OXYMETAZOLINE HCL 0.05 % NA SOLN
NASAL | Status: DC | PRN
  Administered 2015-05-15: 1 via TOPICAL

## 2015-05-15 MED ORDER — FENTANYL CITRATE (PF) 100 MCG/2ML IJ SOLN
INTRAMUSCULAR | Status: DC | PRN
  Administered 2015-05-15: 50 ug via INTRAVENOUS
  Administered 2015-05-15: 100 ug via INTRAVENOUS
  Administered 2015-05-15: 50 ug via INTRAVENOUS

## 2015-05-15 MED ORDER — PROPOFOL 10 MG/ML IV BOLUS
INTRAVENOUS | Status: DC | PRN
  Administered 2015-05-15: 170 mg via INTRAVENOUS
  Administered 2015-05-15: 30 mg via INTRAVENOUS

## 2015-05-15 MED ORDER — MUPIROCIN 2 % EX OINT
TOPICAL_OINTMENT | CUTANEOUS | Status: DC | PRN
Start: 2015-05-15 — End: 2015-05-15
  Administered 2015-05-15: 1 via NASAL

## 2015-05-15 MED ORDER — FLUCONAZOLE 150 MG PO TABS: 150.0000 mg | ORAL_TABLET | Freq: Every day | ORAL | Status: AC

## 2015-05-15 MED ORDER — LACTATED RINGERS IV SOLN
INTRAVENOUS | Status: DC
  Administered 2015-05-15: 09:00:00 via INTRAVENOUS

## 2015-05-15 MED ORDER — FENTANYL CITRATE (PF) 100 MCG/2ML IJ SOLN
INTRAMUSCULAR | Status: AC
Start: 2015-05-15 — End: 2015-05-15
  Filled 2015-05-15: qty 6

## 2015-05-15 MED ORDER — DEXAMETHASONE SODIUM PHOSPHATE 4 MG/ML IJ SOLN
INTRAMUSCULAR | Status: DC | PRN
  Administered 2015-05-15: 10 mg via INTRAVENOUS

## 2015-05-15 MED ORDER — SCOPOLAMINE 1 MG/3DAYS TD PT72: 1.0000 | MEDICATED_PATCH | Freq: Once | TRANSDERMAL | Status: DC | PRN

## 2015-05-15 MED ORDER — AMOXICILLIN-POT CLAVULANATE 875-125 MG PO TABS: 1.0000 | ORAL_TABLET | Freq: Two times a day (BID) | ORAL | Status: AC

## 2015-05-15 MED ORDER — CEFAZOLIN SODIUM-DEXTROSE 2-3 GM-% IV SOLR
INTRAVENOUS | Status: AC
  Filled 2015-05-15: qty 50

## 2015-05-15 MED ORDER — OXYCODONE HCL 5 MG PO TABS: 5.0000 mg | ORAL_TABLET | Freq: Once | ORAL | Status: DC | PRN

## 2015-05-15 MED ORDER — BSS IO SOLN
INTRAOCULAR | Status: DC | PRN
  Administered 2015-05-15: 1 via INTRAOCULAR

## 2015-05-15 MED ORDER — BSS IO SOLN
INTRAOCULAR | Status: AC
  Filled 2015-05-15: qty 15

## 2015-05-15 MED ORDER — MIDAZOLAM HCL 5 MG/5ML IJ SOLN
INTRAMUSCULAR | Status: DC | PRN
  Administered 2015-05-15: 2 mg via INTRAVENOUS

## 2015-05-15 MED ORDER — PROMETHAZINE HCL 25 MG/ML IJ SOLN
6.2500 mg | INTRAMUSCULAR | Status: DC | PRN
Start: 2015-05-15 — End: 2015-05-15

## 2015-05-15 SURGICAL SUPPLY — 52 items
BLADE ROTATE RAD 12 4 M4 (BLADE) IMPLANT
BLADE ROTATE RAD 12 4MM M4 (BLADE)
BLADE ROTATE RAD 40 4 M4 (BLADE) IMPLANT
BLADE ROTATE RAD 40 4MM M4 (BLADE)
BLADE ROTATE TRICUT 4MX13CM M4 (BLADE) ×1
BLADE ROTATE TRICUT 4X13 M4 (BLADE) ×2 IMPLANT
BLADE TRICUT ROTATE M4 4 5PK (BLADE) IMPLANT
BLADE TRICUT ROTATE M4 4MM 5PK (BLADE)
BUR HS RAD FRONTAL 3 (BURR) IMPLANT
BUR HS RAD FRONTAL 3MM (BURR)
CANISTER SUC SOCK COL 7IN (MISCELLANEOUS) ×3 IMPLANT
CANISTER SUCT 1200ML W/VALVE (MISCELLANEOUS) ×3 IMPLANT
COAGULATOR SUCT 8FR VV (MISCELLANEOUS) IMPLANT
DECANTER SPIKE VIAL GLASS SM (MISCELLANEOUS) IMPLANT
DRSG NASAL KENNEDY LMNT 8CM (GAUZE/BANDAGES/DRESSINGS) IMPLANT
DRSG NASOPORE 8CM (GAUZE/BANDAGES/DRESSINGS) IMPLANT
ELECT REM PT RETURN 9FT ADLT (ELECTROSURGICAL) ×3
ELECTRODE REM PT RTRN 9FT ADLT (ELECTROSURGICAL) ×1 IMPLANT
GLOVE BIO SURGEON STRL SZ7.5 (GLOVE) ×3 IMPLANT
GLOVE BIOGEL PI IND STRL 7.0 (GLOVE) IMPLANT
GLOVE BIOGEL PI INDICATOR 7.0 (GLOVE)
GLOVE ECLIPSE 6.5 STRL STRAW (GLOVE) IMPLANT
GLOVE SURG SS PI 7.0 STRL IVOR (GLOVE) ×3 IMPLANT
GOWN STRL REUS W/ TWL LRG LVL3 (GOWN DISPOSABLE) ×3 IMPLANT
GOWN STRL REUS W/TWL LRG LVL3 (GOWN DISPOSABLE) ×6
HEMOSTAT SURGICEL 2X14 (HEMOSTASIS) IMPLANT
IV NS 1000ML (IV SOLUTION)
IV NS 1000ML BAXH (IV SOLUTION) IMPLANT
IV NS 500ML (IV SOLUTION) ×2
IV NS 500ML BAXH (IV SOLUTION) ×1 IMPLANT
NEEDLE PRECISIONGLIDE 27X1.5 (NEEDLE) ×3 IMPLANT
NEEDLE SPNL 25GX3.5 QUINCKE BL (NEEDLE) IMPLANT
NS IRRIG 1000ML POUR BTL (IV SOLUTION) ×3 IMPLANT
PACK BASIN DAY SURGERY FS (CUSTOM PROCEDURE TRAY) ×3 IMPLANT
PACK ENT DAY SURGERY (CUSTOM PROCEDURE TRAY) ×3 IMPLANT
PAD ENT ADHESIVE 25PK (MISCELLANEOUS) ×3 IMPLANT
SLEEVE SCD COMPRESS KNEE MED (MISCELLANEOUS) ×3 IMPLANT
SOLUTION BUTLER CLEAR DIP (MISCELLANEOUS) ×3 IMPLANT
SPLINT NASAL AIRWAY SILICONE (MISCELLANEOUS) IMPLANT
SPONGE GAUZE 2X2 8PLY STER LF (GAUZE/BANDAGES/DRESSINGS) ×1
SPONGE GAUZE 2X2 8PLY STRL LF (GAUZE/BANDAGES/DRESSINGS) ×2 IMPLANT
SPONGE NEURO XRAY DETECT 1X3 (DISPOSABLE) ×3 IMPLANT
SUT ETHILON 3 0 PS 1 (SUTURE) IMPLANT
SUT PLAIN 4 0 ~~LOC~~ 1 (SUTURE) IMPLANT
TOWEL OR 17X24 6PK STRL BLUE (TOWEL DISPOSABLE) ×3 IMPLANT
TRACKER ENT INSTRUMENT (MISCELLANEOUS) ×3 IMPLANT
TRACKER ENT PATIENT (MISCELLANEOUS) ×3 IMPLANT
TUBE CONNECTING 20'X1/4 (TUBING) ×1
TUBE CONNECTING 20X1/4 (TUBING) ×2 IMPLANT
TUBE SALEM SUMP 16 FR W/ARV (TUBING) IMPLANT
TUBING STRAIGHTSHOT EPS 5PK (TUBING) ×3 IMPLANT
YANKAUER SUCT BULB TIP NO VENT (SUCTIONS) ×3 IMPLANT

## 2015-05-15 NOTE — Anesthesia Procedure Notes (Signed)
Procedure Name: Intubation Date/Time: 05/15/2015 10:53 AM Performed by: Lyndee Leo Pre-anesthesia Checklist: Patient identified, Emergency Drugs available, Suction available and Patient being monitored Patient Re-evaluated:Patient Re-evaluated prior to inductionOxygen Delivery Method: Circle System Utilized Preoxygenation: Pre-oxygenation with 100% oxygen Intubation Type: IV induction Ventilation: Mask ventilation without difficulty Laryngoscope Size: Miller and 2 Grade View: Grade III Tube type: Oral Rae Tube size: 7.0 mm Number of attempts: 1 Airway Equipment and Method: Stylet and Oral airway Placement Confirmation: ETT inserted through vocal cords under direct vision,  positive ETCO2 and breath sounds checked- equal and bilateral Secured at: 20 cm Tube secured with: Tape Dental Injury: Teeth and Oropharynx as per pre-operative assessment

## 2015-05-15 NOTE — Anesthesia Postprocedure Evaluation (Signed)
  Anesthesia Post-op Note  Patient: Joyce Mcdonald  Procedure(s) Performed: Procedure(s): BILATERAL ETHMOIDECTOMY AND BILATERAL MAXILLARY ANTROSTOMY (Bilateral)  Patient Location: PACU  Anesthesia Type:General  Level of Consciousness: awake, alert  and oriented  Airway and Oxygen Therapy: Patient Spontanous Breathing  Post-op Pain: none  Post-op Assessment: Post-op Vital signs reviewed              Post-op Vital Signs: Reviewed  Last Vitals:  Filed Vitals:   05/15/15 1330  BP: 135/76  Pulse: 59  Temp:   Resp: 14    Complications: No apparent anesthesia complications

## 2015-05-15 NOTE — Anesthesia Preprocedure Evaluation (Addendum)
Anesthesia Evaluation  Patient identified by MRN, date of birth, ID band Patient awake    Reviewed: Allergy & Precautions, NPO status , Patient's Chart, lab work & pertinent test results  Airway Mallampati: III  TM Distance: >3 FB Neck ROM: Full    Dental  (+) Teeth Intact   Pulmonary asthma ,  breath sounds clear to auscultation        Cardiovascular - angina- DOE negative cardio ROS  Rhythm:Regular Rate:Normal     Neuro/Psych negative neurological ROS     GI/Hepatic negative GI ROS, (+) Hepatitis -, Autoimmune  Endo/Other  Hypothyroidism   Renal/GU negative Renal ROS     Musculoskeletal   Abdominal   Peds  Hematology negative hematology ROS (+)   Anesthesia Other Findings   Reproductive/Obstetrics                            Anesthesia Physical Anesthesia Plan  ASA: II  Anesthesia Plan: General   Post-op Pain Management:    Induction: Intravenous  Airway Management Planned: Oral ETT  Additional Equipment:   Intra-op Plan:   Post-operative Plan: Extubation in OR  Informed Consent: I have reviewed the patients History and Physical, chart, labs and discussed the procedure including the risks, benefits and alternatives for the proposed anesthesia with the patient or authorized representative who has indicated his/her understanding and acceptance.   Dental advisory given  Plan Discussed with: CRNA  Anesthesia Plan Comments:         Anesthesia Quick Evaluation

## 2015-05-15 NOTE — Discharge Instructions (Addendum)

## 2015-05-15 NOTE — Op Note (Signed)
DATE OF PROCEDURE:  05/15/2015                              OPERATIVE REPORT  SURGEON:  Leta Baptist, MD  PREOPERATIVE DIAGNOSES: 1. Bilateral sinonasal polyps  POSTOPERATIVE DIAGNOSES: 1. Bilateral sinonasal polyps 2. Bilateral chronic maxillary and ethmoid sinusitis 3. Bilateral chronic fungal sinusitis  PROCEDURE PERFORMED:   1. Bilateral endoscopic total ethmoidectomy  2. Bilateral endoscopic maxillary antrostomy with tissue removal.  3. FUSION stereotactic image guidance system  ANESTHESIA:  General endotracheal tube anesthesia.  COMPLICATIONS:  None.  ESTIMATED BLOOD LOSS:  Less than 50 mL   INDICATION FOR PROCEDURE:  Joyce Mcdonald is a 54 y.o. female with a history of bilateral chronic rhinosinusitis and bilateral nasal polyps. She was previously treated with multiple courses of antibiotics, topical and systemic spheroids, anti-histamine, and decongested. However she continues to be symptomatic. The patient continues to complain of difficulty with her sense of smell. On her CT scan, she was noted to have significant opacification of her maxillary and ethmoid sinuses. On examination, large polypoid tissue was noted to obstruct both middle meatus and the superior nasal cavities. Based on the above findings, the decision was made for the patient to undergo the above-stated procedure. Likelihood of success in reducing symptoms was also discussed.  The risks, benefits, alternatives, and details of the procedure were discussed with the mother.  Questions were invited and answered.  Informed consent was obtained.  DESCRIPTION:  The patient was taken to the operating room and placed supine on the operating table. General endotracheal tube anesthesia was administered by the anesthesiologist. The patient was positioned and prepped and draped in a standard fashion for sinus surgery. Pledgets soaked with Afrin were placed in both nasal cavities for vasoconstriction. The pledgets were subsequently  removed.   FUSION stereotactic image guidance markers were placed. The image guidance system was noted to be functional throughout the case. Using the rigid endoscope, both nasal cavities were examined. A large amount of polypoid tissue was noted within both nasal cavities. Attention was first focused on the left side. The middle turbinates was carefully medialized. A large amount of polypoid tissue was removed using a combination of Blakesley forceps, Tru-Cut forceps, and microdebrider. The anterior and the posterior ethmoid cavities were opened. Polypoid tissue was also removed from both ethmoid cavities. The maxillary antrum was then entered. At this time, a large amount of fungal debris was noted within the maxillary cavity. Purulent drainage was also noted. All fungal balls were removed. The maxillary and ethmoid cavities were copiously irrigated. Hemostasis was achieved with the Nasopore packing.  The same procedures were repeated on the right side without exception. Similar findings will also noted on the right side.   The care of the patient was turned over to the anesthesiologist.  The patient was awakened from anesthesia without difficulty.  She was extubated and transferred to the recovery room in good condition.  OPERATIVE FINDINGS: Bilateral chronic maxillary and ethmoid sinusitis and polyposis. A large amount of a fungal balls were noted within both maxillary cavities.  SPECIMEN:  bilateral sinus contents.   FOLLOWUP CARE:  The patient will be discharged home once awake and alert.  She will be placed on  Augmentin 875 mg by mouth twice a day for 7 days, fluconazole 150 mg by mouth daily for 7 days.  Tylenol with oxycodone can be taken on a p.r.n. basis for additional pain control.  The patient will follow up in my office in approximately 1 week.  Ascencion Dike 05/15/2015 12:34 PM

## 2015-05-15 NOTE — Transfer of Care (Signed)
Immediate Anesthesia Transfer of Care Note  Patient: Joyce Mcdonald  Procedure(s) Performed: Procedure(s): BILATERAL ETHMOIDECTOMY AND BILATERAL MAXILLARY ANTROSTOMY (Bilateral)  Patient Location: PACU  Anesthesia Type:General  Level of Consciousness: awake, sedated and patient cooperative  Airway & Oxygen Therapy: Patient Spontanous Breathing and aerosol face mask  Post-op Assessment: Report given to RN and Post -op Vital signs reviewed and stable  Post vital signs: Reviewed and stable  Last Vitals:  Filed Vitals:   05/15/15 0819  BP: 146/80  Pulse: 63  Temp: 36.6 C  Resp: 16    Complications: No apparent anesthesia complications

## 2015-05-15 NOTE — H&P (Signed)
Cc: Bilateral nasal polyps, nasal obstructions  HPI: The patient is a 54 year old female who returns today for her follow-up evaluation.  The patient has a history of bilateral sinonasal polyps.  Her previous anosmia had improved with her prednisone treatment.  However, she continues to have significant nasal polyposis on exam.  She recently underwent a paranasal sinus CT scan.  The CT showed bilateral maxillary and ethmoid opacifications, consistent with sinonasal polyps.  The patient returns today complaining of occasional nasal obstruction. Her sense of smell and taste has slightly improved since she was treated with 2 courses of extended prednisone.  The patient is interested in undergoing more definitive treatment of her sinonasal polyps.   Exam General: Communicates without difficulty, well nourished, no acute distress. Head: Normocephalic, no evidence injury, no tenderness, facial buttresses intact without stepoff. Eyes: PERRL, EOMI. No scleral icterus, conjunctivae clear. Neuro: CN II exam reveals vision grossly intact.  No nystagmus at any point of gaze. Ears: Auricles well formed without lesions.  Ear canals are intact without mass or lesion.  No erythema or edema is appreciated.  The TMs are intact without fluid. Nose: External evaluation reveals normal support and skin without lesions.  Dorsum is intact.  Anterior rhinoscopy reveals congested and edematous mucosa over anterior aspect of the inferior turbinates and nasal septum.  No purulence is noted.  Bilateral nasal polyps. Oral:  Oral cavity and oropharynx are intact, symmetric, without erythema or edema.  Mucosa is moist without lesions. Neck: Full range of motion without pain.  There is no significant lymphadenopathy.  No masses palpable.  Thyroid bed within normal limits to palpation.  Parotid glands and submandibular glands equal bilaterally without mass.  Trachea is midline. Neuro:  CN 2-12 grossly intact. Gait normal. Vestibular: No  nystagmus at any point of gaze.   Assessment 1. The patient continues to have significant bilateral nasal polyposis.  Her CT scan showed opacification of the maxillary and ethmoid sinuses.   Plan  1.  The physical exam findings and the CT images are reviewed with the patient.  2.  Based on the above findings, the patient would likely benefit from undergoing bilateral endoscopic sinus surgery to remove her sinonasal polyps.  The surgery will be performed using FUSION Image Guidance.  3.  The risks, benefits, alternatives and details of the procedure are reviewed with the patient. The patient would like to proceed with the procedures.

## 2015-05-16 ENCOUNTER — Encounter (HOSPITAL_BASED_OUTPATIENT_CLINIC_OR_DEPARTMENT_OTHER): Payer: Self-pay | Admitting: Otolaryngology

## 2015-05-24 ENCOUNTER — Ambulatory Visit (INDEPENDENT_AMBULATORY_CARE_PROVIDER_SITE_OTHER): Payer: Managed Care, Other (non HMO) | Admitting: Otolaryngology

## 2015-05-24 DIAGNOSIS — J320 Chronic maxillary sinusitis: Secondary | ICD-10-CM

## 2015-05-24 DIAGNOSIS — J322 Chronic ethmoidal sinusitis: Secondary | ICD-10-CM

## 2015-05-31 ENCOUNTER — Encounter (HOSPITAL_BASED_OUTPATIENT_CLINIC_OR_DEPARTMENT_OTHER): Payer: Self-pay | Admitting: Otolaryngology

## 2015-06-07 ENCOUNTER — Ambulatory Visit (INDEPENDENT_AMBULATORY_CARE_PROVIDER_SITE_OTHER): Payer: Managed Care, Other (non HMO) | Admitting: Otolaryngology

## 2015-06-07 DIAGNOSIS — J322 Chronic ethmoidal sinusitis: Secondary | ICD-10-CM

## 2015-06-07 DIAGNOSIS — J320 Chronic maxillary sinusitis: Secondary | ICD-10-CM | POA: Diagnosis not present

## 2015-06-21 ENCOUNTER — Ambulatory Visit (INDEPENDENT_AMBULATORY_CARE_PROVIDER_SITE_OTHER): Payer: Managed Care, Other (non HMO) | Admitting: Otolaryngology

## 2015-06-21 DIAGNOSIS — J320 Chronic maxillary sinusitis: Secondary | ICD-10-CM

## 2015-06-21 DIAGNOSIS — J322 Chronic ethmoidal sinusitis: Secondary | ICD-10-CM | POA: Diagnosis not present

## 2015-06-21 DIAGNOSIS — J330 Polyp of nasal cavity: Secondary | ICD-10-CM | POA: Diagnosis not present

## 2015-08-23 ENCOUNTER — Ambulatory Visit (INDEPENDENT_AMBULATORY_CARE_PROVIDER_SITE_OTHER): Payer: Managed Care, Other (non HMO) | Admitting: Otolaryngology

## 2015-08-23 DIAGNOSIS — J320 Chronic maxillary sinusitis: Secondary | ICD-10-CM | POA: Diagnosis not present

## 2015-08-23 DIAGNOSIS — J322 Chronic ethmoidal sinusitis: Secondary | ICD-10-CM | POA: Diagnosis not present

## 2015-08-23 DIAGNOSIS — J330 Polyp of nasal cavity: Secondary | ICD-10-CM | POA: Diagnosis not present

## 2015-12-27 ENCOUNTER — Ambulatory Visit (INDEPENDENT_AMBULATORY_CARE_PROVIDER_SITE_OTHER): Payer: Managed Care, Other (non HMO) | Admitting: Otolaryngology

## 2015-12-27 DIAGNOSIS — J322 Chronic ethmoidal sinusitis: Secondary | ICD-10-CM

## 2015-12-27 DIAGNOSIS — J320 Chronic maxillary sinusitis: Secondary | ICD-10-CM

## 2015-12-27 DIAGNOSIS — J330 Polyp of nasal cavity: Secondary | ICD-10-CM

## 2016-03-10 ENCOUNTER — Other Ambulatory Visit (HOSPITAL_COMMUNITY): Payer: Self-pay | Admitting: Pulmonary Disease

## 2016-03-10 DIAGNOSIS — Z1231 Encounter for screening mammogram for malignant neoplasm of breast: Secondary | ICD-10-CM

## 2016-03-12 ENCOUNTER — Ambulatory Visit (HOSPITAL_COMMUNITY)
Admission: RE | Admit: 2016-03-12 | Discharge: 2016-03-12 | Disposition: A | Discharge status: Home / Self Care | Payer: Managed Care, Other (non HMO) | Source: Ambulatory Visit | Attending: Pulmonary Disease | Admitting: Pulmonary Disease

## 2016-03-12 DIAGNOSIS — Z1231 Encounter for screening mammogram for malignant neoplasm of breast: Secondary | ICD-10-CM | POA: Insufficient documentation

## 2016-04-07 ENCOUNTER — Ambulatory Visit (INDEPENDENT_AMBULATORY_CARE_PROVIDER_SITE_OTHER): Payer: Managed Care, Other (non HMO) | Admitting: Otolaryngology

## 2016-04-07 DIAGNOSIS — J320 Chronic maxillary sinusitis: Secondary | ICD-10-CM | POA: Diagnosis not present

## 2016-04-07 DIAGNOSIS — J330 Polyp of nasal cavity: Secondary | ICD-10-CM | POA: Diagnosis not present

## 2016-04-07 DIAGNOSIS — J322 Chronic ethmoidal sinusitis: Secondary | ICD-10-CM | POA: Diagnosis not present

## 2016-09-22 ENCOUNTER — Ambulatory Visit (INDEPENDENT_AMBULATORY_CARE_PROVIDER_SITE_OTHER): Payer: Managed Care, Other (non HMO) | Admitting: Otolaryngology

## 2016-09-22 DIAGNOSIS — J320 Chronic maxillary sinusitis: Secondary | ICD-10-CM

## 2016-09-22 DIAGNOSIS — J330 Polyp of nasal cavity: Secondary | ICD-10-CM | POA: Diagnosis not present

## 2016-09-22 DIAGNOSIS — J322 Chronic ethmoidal sinusitis: Secondary | ICD-10-CM

## 2017-02-05 ENCOUNTER — Other Ambulatory Visit (HOSPITAL_COMMUNITY): Payer: Self-pay | Admitting: Pulmonary Disease

## 2017-02-05 DIAGNOSIS — Z1231 Encounter for screening mammogram for malignant neoplasm of breast: Secondary | ICD-10-CM

## 2017-03-23 ENCOUNTER — Ambulatory Visit (INDEPENDENT_AMBULATORY_CARE_PROVIDER_SITE_OTHER): Payer: No Typology Code available for payment source | Admitting: Otolaryngology

## 2017-03-23 DIAGNOSIS — J320 Chronic maxillary sinusitis: Secondary | ICD-10-CM

## 2017-03-23 DIAGNOSIS — J330 Polyp of nasal cavity: Secondary | ICD-10-CM | POA: Diagnosis not present

## 2017-03-23 DIAGNOSIS — J322 Chronic ethmoidal sinusitis: Secondary | ICD-10-CM

## 2017-06-29 ENCOUNTER — Ambulatory Visit (INDEPENDENT_AMBULATORY_CARE_PROVIDER_SITE_OTHER): Payer: No Typology Code available for payment source | Admitting: Otolaryngology

## 2017-06-29 DIAGNOSIS — J310 Chronic rhinitis: Secondary | ICD-10-CM

## 2017-06-29 DIAGNOSIS — J322 Chronic ethmoidal sinusitis: Secondary | ICD-10-CM

## 2017-06-29 DIAGNOSIS — J320 Chronic maxillary sinusitis: Secondary | ICD-10-CM | POA: Diagnosis not present

## 2017-07-13 ENCOUNTER — Ambulatory Visit (HOSPITAL_COMMUNITY)
Admission: RE | Admit: 2017-07-13 | Discharge: 2017-07-13 | Disposition: A | Discharge status: Home / Self Care | Payer: No Typology Code available for payment source | Source: Ambulatory Visit | Attending: Pulmonary Disease | Admitting: Pulmonary Disease

## 2017-07-13 DIAGNOSIS — Z1231 Encounter for screening mammogram for malignant neoplasm of breast: Secondary | ICD-10-CM | POA: Insufficient documentation

## 2017-11-05 ENCOUNTER — Ambulatory Visit (INDEPENDENT_AMBULATORY_CARE_PROVIDER_SITE_OTHER): Payer: No Typology Code available for payment source | Admitting: Otolaryngology

## 2017-11-05 DIAGNOSIS — J322 Chronic ethmoidal sinusitis: Secondary | ICD-10-CM | POA: Diagnosis not present

## 2017-11-05 DIAGNOSIS — J320 Chronic maxillary sinusitis: Secondary | ICD-10-CM | POA: Diagnosis not present

## 2017-11-05 DIAGNOSIS — J330 Polyp of nasal cavity: Secondary | ICD-10-CM | POA: Diagnosis not present

## 2018-03-03 LAB — CMP 10231
ALBUMIN/GLOBULIN RATIO: 1
ALT: 49 — AB (ref 3–30)
AST: 65
Albumin: 4
Alkaline Phosphatase: 46
BUN/Creatinine Ratio: 29
BUN: 26 — AB (ref 4–21)
Calcium: 9.5
Carbon Dioxide, Total: 28
Chloride: 104
Creat: 0.9
EGFR (African American): 83
EGFR (Non-African Amer.): 71
Globulin: 4.1
Glucose: 102
Potassium: 4.8
Sodium: 137
Total Bilirubin: 1.1
Total Protein: 8.1 (ref 6.4–8.2)

## 2018-03-03 LAB — TSH
T3, Free: 2.2
TSH: 3.36

## 2018-03-03 LAB — LIPID PANEL
Cholesterol, Total: 173
HDL Cholesterol: 54 (ref 35–70)
Non HDL Cholesterol: 119
Total CHOL/HDL Ratio: 3.2
Triglycerides: 125 (ref 40–160)

## 2018-03-04 LAB — CBC WITH DIFF/PLATELET
BASO(ABSOLUTE): 28
Basophils: 0.4
Eosinophils Absolute: 497
Eosinophils, %: 7.2
HCT: 37 (ref 29–41)
Hemoglobin: 12.9
Lymphocytes: 35.6
Lymphs Abs: 2456
MCH: 32.8
MCHC: 34.6
MCV: 94.9 (ref 76–111)
MPV: 12 fL — AB (ref 7.5–11.5)
Monocytes(Absolute): 932
Monocytes: 13.5
Neutro Abs: 2988
Neutrophils: 43.3
RBC: 3.93 (ref 3.87–5.11)
RDW: 12.7
WBC: 6.9
platelet count: 247

## 2018-03-11 ENCOUNTER — Ambulatory Visit (INDEPENDENT_AMBULATORY_CARE_PROVIDER_SITE_OTHER): Payer: No Typology Code available for payment source | Admitting: Otolaryngology

## 2018-03-11 DIAGNOSIS — J330 Polyp of nasal cavity: Secondary | ICD-10-CM | POA: Diagnosis not present

## 2018-03-11 DIAGNOSIS — J310 Chronic rhinitis: Secondary | ICD-10-CM

## 2018-07-12 ENCOUNTER — Other Ambulatory Visit (HOSPITAL_COMMUNITY): Payer: Self-pay | Admitting: Pulmonary Disease

## 2018-07-12 DIAGNOSIS — Z1231 Encounter for screening mammogram for malignant neoplasm of breast: Secondary | ICD-10-CM

## 2018-07-14 ENCOUNTER — Ambulatory Visit (HOSPITAL_COMMUNITY)
Admission: RE | Admit: 2018-07-14 | Discharge: 2018-07-14 | Disposition: A | Discharge status: Home / Self Care | Payer: 59 | Source: Ambulatory Visit | Attending: Pulmonary Disease | Admitting: Pulmonary Disease

## 2018-07-14 DIAGNOSIS — Z1231 Encounter for screening mammogram for malignant neoplasm of breast: Secondary | ICD-10-CM | POA: Insufficient documentation

## 2018-07-14 LAB — HM MAMMOGRAPHY

## 2018-08-03 ENCOUNTER — Other Ambulatory Visit (INDEPENDENT_AMBULATORY_CARE_PROVIDER_SITE_OTHER): Payer: Self-pay | Admitting: Otolaryngology

## 2018-09-06 ENCOUNTER — Ambulatory Visit (INDEPENDENT_AMBULATORY_CARE_PROVIDER_SITE_OTHER): Payer: 59 | Admitting: Otolaryngology

## 2018-09-06 DIAGNOSIS — J310 Chronic rhinitis: Secondary | ICD-10-CM | POA: Diagnosis not present

## 2018-09-06 DIAGNOSIS — J330 Polyp of nasal cavity: Secondary | ICD-10-CM | POA: Diagnosis not present

## 2019-08-11 ENCOUNTER — Other Ambulatory Visit: Payer: Self-pay

## 2019-08-11 ENCOUNTER — Ambulatory Visit (INDEPENDENT_AMBULATORY_CARE_PROVIDER_SITE_OTHER): Payer: 59 | Admitting: Otolaryngology

## 2019-08-11 DIAGNOSIS — J320 Chronic maxillary sinusitis: Secondary | ICD-10-CM

## 2019-08-11 DIAGNOSIS — J338 Other polyp of sinus: Secondary | ICD-10-CM

## 2019-08-11 DIAGNOSIS — J322 Chronic ethmoidal sinusitis: Secondary | ICD-10-CM | POA: Diagnosis not present

## 2020-04-04 ENCOUNTER — Ambulatory Visit: Payer: 59 | Admitting: Family Medicine

## 2020-06-27 ENCOUNTER — Other Ambulatory Visit (HOSPITAL_COMMUNITY): Payer: Self-pay | Admitting: Family Medicine

## 2020-06-27 DIAGNOSIS — Z1231 Encounter for screening mammogram for malignant neoplasm of breast: Secondary | ICD-10-CM

## 2020-07-12 ENCOUNTER — Other Ambulatory Visit: Payer: Self-pay

## 2020-07-12 ENCOUNTER — Ambulatory Visit (HOSPITAL_COMMUNITY)
Admission: RE | Admit: 2020-07-12 | Discharge: 2020-07-12 | Disposition: A | Discharge status: Home / Self Care | Payer: 59 | Source: Ambulatory Visit | Attending: Family Medicine | Admitting: Family Medicine

## 2020-07-12 DIAGNOSIS — Z1231 Encounter for screening mammogram for malignant neoplasm of breast: Secondary | ICD-10-CM | POA: Diagnosis present

## 2021-01-24 ENCOUNTER — Ambulatory Visit (INDEPENDENT_AMBULATORY_CARE_PROVIDER_SITE_OTHER): Payer: 59

## 2021-01-24 ENCOUNTER — Other Ambulatory Visit: Payer: Self-pay

## 2021-01-24 ENCOUNTER — Ambulatory Visit: Payer: 59 | Admitting: Podiatry

## 2021-01-24 ENCOUNTER — Encounter: Payer: Self-pay | Admitting: Podiatry

## 2021-01-24 DIAGNOSIS — S9002XA Contusion of left ankle, initial encounter: Secondary | ICD-10-CM

## 2021-01-24 DIAGNOSIS — M25372 Other instability, left ankle: Secondary | ICD-10-CM

## 2021-01-24 NOTE — Patient Instructions (Signed)
[facility]  [address] [place], [place] [address]

## 2021-01-25 NOTE — Progress Notes (Signed)
  Subjective:  Patient ID: Joyce Mcdonald, female    DOB: [birth year],  MRN: [id number]  Chief Complaint  Patient presents with  . Foot Pain    Left foot pain/ankle  PT stated that she feels like her foot is going to give out when she is walking     60 y.o. female presents with the above complaint. History confirmed with patient. Pain she is having started after she dropped a can on the foot and feels that her ankle gives out when she is getting up out of bed and walking.  Objective:  Physical Exam: warm, good capillary refill, no trophic changes or ulcerative lesions, normal DP and PT pulses and normal sensory exam. Left Foot: no pain on palpation to the anterior ankle and midtarsal joint, no pain with resisted dorsiflexion or plantar flexion, strength is 5 out of 5 in all planes , negative Lachman's and talar tilt test left ankle, no pain over the ATFL CFL or peroneal tendons   Radiographs: X-ray of left ankle: no fracture, dislocation, swelling or degenerative changes noted Assessment:   1. Contusion of left ankle, initial encounter   2. Left ankle instability      Plan:  Patient was evaluated and treated and all questions answered.  Discussed with her that she likely has a contusion of the dorsal cutaneous nerves from the direct impact as well as left ankle instability which I believe is mostly functional.  This should resolve with physical therapy and immobilization.  Dispensed TriLock ankle brace for support. Recommended NSAIDs as needed for pain  Return in about 6 weeks (around 03/07/2021) for re-check ankle instability.

## 2021-03-07 ENCOUNTER — Ambulatory Visit: Payer: 59 | Admitting: Podiatry

## 2021-12-30 ENCOUNTER — Other Ambulatory Visit (HOSPITAL_COMMUNITY): Payer: Self-pay | Admitting: Internal Medicine

## 2021-12-30 DIAGNOSIS — Z1231 Encounter for screening mammogram for malignant neoplasm of breast: Secondary | ICD-10-CM

## 2022-01-03 ENCOUNTER — Ambulatory Visit (HOSPITAL_COMMUNITY)
Admission: RE | Admit: 2022-01-03 | Discharge: 2022-01-03 | Disposition: A | Discharge status: Home / Self Care | Payer: 59 | Source: Ambulatory Visit | Attending: Internal Medicine | Admitting: Internal Medicine

## 2022-01-03 ENCOUNTER — Other Ambulatory Visit: Payer: Self-pay

## 2022-01-03 DIAGNOSIS — Z1231 Encounter for screening mammogram for malignant neoplasm of breast: Secondary | ICD-10-CM | POA: Insufficient documentation

## 2022-04-10 ENCOUNTER — Encounter: Payer: Self-pay | Admitting: *Deleted

## 2022-10-16 ENCOUNTER — Other Ambulatory Visit: Payer: Self-pay | Admitting: Internal Medicine

## 2022-10-16 DIAGNOSIS — Z1231 Encounter for screening mammogram for malignant neoplasm of breast: Secondary | ICD-10-CM

## 2023-01-13 ENCOUNTER — Other Ambulatory Visit (HOSPITAL_COMMUNITY): Payer: Self-pay | Admitting: Internal Medicine

## 2023-01-13 DIAGNOSIS — Z1231 Encounter for screening mammogram for malignant neoplasm of breast: Secondary | ICD-10-CM

## 2023-01-14 ENCOUNTER — Ambulatory Visit (HOSPITAL_COMMUNITY)
Admission: RE | Admit: 2023-01-14 | Discharge: 2023-01-14 | Disposition: A | Discharge status: Home / Self Care | Payer: 59 | Source: Ambulatory Visit | Attending: Internal Medicine | Admitting: Internal Medicine

## 2023-01-14 ENCOUNTER — Encounter (HOSPITAL_COMMUNITY): Payer: Self-pay

## 2023-01-14 DIAGNOSIS — Z1231 Encounter for screening mammogram for malignant neoplasm of breast: Secondary | ICD-10-CM | POA: Diagnosis not present

## 2023-08-17 ENCOUNTER — Ambulatory Visit (INDEPENDENT_AMBULATORY_CARE_PROVIDER_SITE_OTHER): Payer: 59

## 2023-08-17 ENCOUNTER — Encounter (INDEPENDENT_AMBULATORY_CARE_PROVIDER_SITE_OTHER): Payer: Self-pay

## 2023-08-17 VITALS — Ht 63.0 in | Wt 190.0 lb

## 2023-08-17 DIAGNOSIS — J329 Chronic sinusitis, unspecified: Secondary | ICD-10-CM

## 2023-08-17 DIAGNOSIS — J338 Other polyp of sinus: Secondary | ICD-10-CM | POA: Diagnosis not present

## 2023-08-17 DIAGNOSIS — J324 Chronic pansinusitis: Secondary | ICD-10-CM

## 2023-08-19 DIAGNOSIS — J324 Chronic pansinusitis: Secondary | ICD-10-CM | POA: Insufficient documentation

## 2023-08-19 DIAGNOSIS — J338 Other polyp of sinus: Secondary | ICD-10-CM | POA: Insufficient documentation

## 2023-08-19 NOTE — Progress Notes (Signed)
Patient ID: Joyce Mcdonald, female   DOB: 01-Sep-1961, 62 y.o.   MRN: [id number]  Follow up: Bilateral chronic sinusitis and polyposis  HPI: The patient is a 62 year old female who returns today for her follow-up evaluation. The patient has a history of bilateral chronic rhinosinusitis and polyposis. She previously underwent bilateral endoscopic sinus surgery. She was last seen 12 months ago. Minimal polypoid tissue was noted within the superior nasal cavity. The patient was continued on her daily Flonase. She returns today with no new complaints. She has no significant facial pain, fever or visual change. No nasal congestion is noted. She is using Flonase nasal spray daily.   Exam: General: Communicates without difficulty, well nourished, no acute distress. Head: Normocephalic, no evidence injury, no tenderness, facial buttresses intact without stepoff. Face/sinus: No tenderness to palpation and percussion. Facial movement is normal and symmetric. Eyes: PERRL, EOMI. No scleral icterus, conjunctivae clear. Neuro: CN II exam reveals vision grossly intact.  No nystagmus at any point of gaze. Ears: Auricles well formed without lesions.  Ear canals are intact without mass or lesion.  No erythema or edema is appreciated.  The TMs are intact without fluid. Nose: External evaluation reveals normal support and skin without lesions.  Dorsum is intact.  Anterior rhinoscopy reveals congested mucosa over anterior aspect of inferior turbinates and intact septum.  No purulence noted.  The sinus cavities are patent.  Oral:  Oral cavity and oropharynx are intact, symmetric, without erythema or edema.  Mucosa is moist without lesions. Neck: Full range of motion without pain.  There is no significant lymphadenopathy.  No masses palpable.  Thyroid bed within normal limits to palpation.  Parotid glands and submandibular glands equal bilaterally without mass.  Trachea is midline. Neuro:  CN 2-12 grossly intact.    Assessment:   Her maxillary and ethmoid cavities are widely patent. Mild mucosal edema. No significant recurrent polyps are noted.   Plan: 1. The physical exam findings are reviewed with the patient.  2. The patient should continue her Flonase nasal spray daily.  3. The patient will return for re-evaluation in 12 months, sooner if needed.

## 2024-01-12 ENCOUNTER — Other Ambulatory Visit (HOSPITAL_COMMUNITY): Payer: Self-pay | Admitting: Internal Medicine

## 2024-01-12 DIAGNOSIS — Z1231 Encounter for screening mammogram for malignant neoplasm of breast: Secondary | ICD-10-CM

## 2024-03-01 ENCOUNTER — Telehealth (INDEPENDENT_AMBULATORY_CARE_PROVIDER_SITE_OTHER): Payer: Self-pay | Admitting: Otolaryngology

## 2024-03-01 NOTE — Telephone Encounter (Signed)
 Flonase nasal spray was refilled 90 day supply with one refill.

## 2024-05-30 ENCOUNTER — Ambulatory Visit (HOSPITAL_COMMUNITY)
Admission: RE | Admit: 2024-05-30 | Discharge: 2024-05-30 | Disposition: A | Discharge status: Home / Self Care | Source: Ambulatory Visit | Attending: Internal Medicine | Admitting: Internal Medicine

## 2024-05-30 DIAGNOSIS — Z1231 Encounter for screening mammogram for malignant neoplasm of breast: Secondary | ICD-10-CM | POA: Insufficient documentation

## 2024-07-25 ENCOUNTER — Encounter (INDEPENDENT_AMBULATORY_CARE_PROVIDER_SITE_OTHER): Payer: Self-pay | Admitting: Otolaryngology

## 2024-07-25 ENCOUNTER — Ambulatory Visit (INDEPENDENT_AMBULATORY_CARE_PROVIDER_SITE_OTHER): Admitting: Otolaryngology

## 2024-07-25 VITALS — BP 155/78 | HR 60 | Temp 98.2°F

## 2024-07-25 DIAGNOSIS — J3489 Other specified disorders of nose and nasal sinuses: Secondary | ICD-10-CM | POA: Diagnosis not present

## 2024-07-25 DIAGNOSIS — J338 Other polyp of sinus: Secondary | ICD-10-CM

## 2024-07-25 DIAGNOSIS — J324 Chronic pansinusitis: Secondary | ICD-10-CM

## 2024-07-28 NOTE — Progress Notes (Signed)
 Patient ID: Joyce Mcdonald, female   DOB: 1961-02-26, 63 y.o.   MRN: [id number]  Follow up: Bilateral chronic sinusitis and polyposis  HPI: The patient is a 63 year old female who returns today for her follow-up evaluation. The patient has a history of bilateral chronic rhinosinusitis and polyposis. She previously underwent bilateral endoscopic sinus surgery. She was last seen 1 year ago.  No significant recurrent polyposis was noted.  The patient was continued on her daily Flonase. She returns today with no new complaints. She has no significant facial pain, fever or visual change.  Minimal nasal congestion is noted. She is using Flonase nasal spray daily.   Exam: General: Communicates without difficulty, well nourished, no acute distress. Head: Normocephalic, no evidence injury, no tenderness, facial buttresses intact without stepoff. Face/sinus: No tenderness to palpation and percussion. Facial movement is normal and symmetric. Eyes: PERRL, EOMI. No scleral icterus, conjunctivae clear. Neuro: CN II exam reveals vision grossly intact.  No nystagmus at any point of gaze. Ears: Auricles well formed without lesions.  Ear canals are intact without mass or lesion.  No erythema or edema is appreciated.  The TMs are intact without fluid. Nose: External evaluation reveals normal support and skin without lesions.  Dorsum is intact.  Anterior rhinoscopy reveals mildly congested mucosa over anterior aspect of inferior turbinates and intact septum.  No purulence noted.  The sinus cavities are patent.  Oral:  Oral cavity and oropharynx are intact, symmetric, without erythema or edema.  Mucosa is moist without lesions. Neck: Full range of motion without pain.  There is no significant lymphadenopathy.  No masses palpable.  Thyroid bed within normal limits to palpation.  Parotid glands and submandibular glands equal bilaterally without mass.  Trachea is midline. Neuro:  CN 2-12 grossly intact.    Assessment:  1.  Her  maxillary and ethmoid cavities are widely patent.  2.  Mild mucosal edema. No significant recurrent polyps are noted.  3.  No recurrent infection is noted.  Plan: 1. The physical exam findings are reviewed with the patient.  2. The patient should continue her Flonase nasal spray daily.  3. The patient will return for re-evaluation in 12 months, sooner if needed.

## 2024-09-28 ENCOUNTER — Other Ambulatory Visit (INDEPENDENT_AMBULATORY_CARE_PROVIDER_SITE_OTHER): Payer: Self-pay | Admitting: Otolaryngology

## 2024-09-28 MED ORDER — FLUTICASONE PROPIONATE 50 MCG/ACT NA SUSP: 2.0000 | Freq: Every day | NASAL | 10 refills | Status: AC
